# Patient Record
Sex: Female | Born: 1945 | Race: White | Hispanic: No | Marital: Married | Smoking: Never smoker
Health system: Southern US, Community
[De-identification: ages and names within clinical notes are randomized; demographics above are authoritative.]

## PROBLEM LIST (undated history)

## (undated) DIAGNOSIS — K900 Celiac disease: Secondary | ICD-10-CM

## (undated) DIAGNOSIS — E2839 Other primary ovarian failure: Secondary | ICD-10-CM

## (undated) DIAGNOSIS — K219 Gastro-esophageal reflux disease without esophagitis: Secondary | ICD-10-CM

## (undated) DIAGNOSIS — K589 Irritable bowel syndrome without diarrhea: Secondary | ICD-10-CM

## (undated) DIAGNOSIS — K649 Unspecified hemorrhoids: Secondary | ICD-10-CM

## (undated) DIAGNOSIS — K921 Melena: Secondary | ICD-10-CM

## (undated) DIAGNOSIS — F419 Anxiety disorder, unspecified: Secondary | ICD-10-CM

## (undated) DIAGNOSIS — E785 Hyperlipidemia, unspecified: Secondary | ICD-10-CM

## (undated) DIAGNOSIS — Z87898 Personal history of other specified conditions: Secondary | ICD-10-CM

## (undated) DIAGNOSIS — I517 Cardiomegaly: Secondary | ICD-10-CM

## (undated) DIAGNOSIS — M3500 Sicca syndrome, unspecified: Secondary | ICD-10-CM

## (undated) DIAGNOSIS — R1013 Epigastric pain: Secondary | ICD-10-CM

## (undated) DIAGNOSIS — K5900 Constipation, unspecified: Secondary | ICD-10-CM

## (undated) DIAGNOSIS — D72820 Lymphocytosis (symptomatic): Secondary | ICD-10-CM

## (undated) DIAGNOSIS — R232 Flushing: Secondary | ICD-10-CM

## (undated) DIAGNOSIS — K8020 Calculus of gallbladder without cholecystitis without obstruction: Secondary | ICD-10-CM

## (undated) DIAGNOSIS — R109 Unspecified abdominal pain: Secondary | ICD-10-CM

## (undated) DIAGNOSIS — M542 Cervicalgia: Secondary | ICD-10-CM

## (undated) DIAGNOSIS — F32A Depression, unspecified: Secondary | ICD-10-CM

## (undated) HISTORY — DX: Cervicalgia: M54.2

## (undated) HISTORY — DX: Celiac disease: K90.0

## (undated) HISTORY — DX: Cardiomegaly: I51.7

## (undated) HISTORY — DX: Depression, unspecified: F32.A

## (undated) HISTORY — DX: Epigastric pain: R10.13

## (undated) HISTORY — DX: Irritable bowel syndrome, unspecified: K58.9

## (undated) HISTORY — DX: Constipation, unspecified: K59.00

## (undated) HISTORY — DX: Other primary ovarian failure: E28.39

## (undated) HISTORY — DX: Gastro-esophageal reflux disease without esophagitis: K21.9

## (undated) HISTORY — DX: Unspecified hemorrhoids: K64.9

## (undated) HISTORY — PX: CHOLECYSTECTOMY: SHX55

## (undated) HISTORY — DX: Flushing: R23.2

## (undated) HISTORY — DX: Anxiety disorder, unspecified: F41.9

## (undated) HISTORY — DX: Unspecified abdominal pain: R10.9

## (undated) HISTORY — DX: Calculus of gallbladder without cholecystitis without obstruction: K80.20

## (undated) HISTORY — DX: Sjogren syndrome, unspecified: M35.00

## (undated) HISTORY — DX: Lymphocytosis (symptomatic): D72.820

## (undated) HISTORY — DX: Hyperlipidemia, unspecified: E78.5

## (undated) HISTORY — DX: Melena: K92.1

## (undated) HISTORY — DX: Personal history of other specified conditions: Z87.898

---

## 1974-05-03 HISTORY — PX: AUGMENTATION MAMMAPLASTY: SUR837

## 1991-05-04 HISTORY — PX: AUGMENTATION MAMMAPLASTY: SUR837

## 1999-04-13 ENCOUNTER — Encounter: Payer: Self-pay | Admitting: Obstetrics and Gynecology

## 1999-04-13 ENCOUNTER — Encounter: Admission: RE | Admit: 1999-04-13 | Discharge: 1999-04-13 | Payer: Self-pay | Admitting: Obstetrics and Gynecology

## 1999-05-22 ENCOUNTER — Encounter: Admission: RE | Admit: 1999-05-22 | Discharge: 1999-05-22 | Payer: Self-pay | Admitting: Obstetrics and Gynecology

## 1999-05-22 ENCOUNTER — Encounter: Payer: Self-pay | Admitting: Obstetrics and Gynecology

## 2000-04-13 ENCOUNTER — Encounter: Payer: Self-pay | Admitting: Obstetrics and Gynecology

## 2000-04-13 ENCOUNTER — Encounter: Admission: RE | Admit: 2000-04-13 | Discharge: 2000-04-13 | Payer: Self-pay | Admitting: Obstetrics and Gynecology

## 2001-04-17 ENCOUNTER — Encounter: Payer: Self-pay | Admitting: Obstetrics and Gynecology

## 2001-04-17 ENCOUNTER — Encounter: Admission: RE | Admit: 2001-04-17 | Discharge: 2001-04-17 | Payer: Self-pay | Admitting: Obstetrics and Gynecology

## 2003-02-06 ENCOUNTER — Encounter: Admission: RE | Admit: 2003-02-06 | Discharge: 2003-02-06 | Payer: Self-pay | Admitting: Gastroenterology

## 2003-02-06 ENCOUNTER — Encounter: Payer: Self-pay | Admitting: Gastroenterology

## 2003-03-20 ENCOUNTER — Encounter (INDEPENDENT_AMBULATORY_CARE_PROVIDER_SITE_OTHER): Payer: Self-pay | Admitting: Specialist

## 2003-03-20 ENCOUNTER — Observation Stay (HOSPITAL_COMMUNITY): Admission: RE | Admit: 2003-03-20 | Discharge: 2003-03-21 | Payer: Self-pay | Admitting: Surgery

## 2004-06-08 ENCOUNTER — Encounter: Admission: RE | Admit: 2004-06-08 | Discharge: 2004-06-08 | Payer: Self-pay | Admitting: Surgery

## 2004-06-12 ENCOUNTER — Encounter: Admission: RE | Admit: 2004-06-12 | Discharge: 2004-06-12 | Payer: Self-pay | Admitting: Surgery

## 2004-08-24 ENCOUNTER — Encounter: Admission: RE | Admit: 2004-08-24 | Discharge: 2004-08-24 | Payer: Self-pay | Admitting: Family Medicine

## 2004-12-02 ENCOUNTER — Encounter: Admission: RE | Admit: 2004-12-02 | Discharge: 2004-12-02 | Payer: Self-pay | Admitting: Obstetrics and Gynecology

## 2005-05-10 ENCOUNTER — Encounter: Admission: RE | Admit: 2005-05-10 | Discharge: 2005-05-10 | Payer: Self-pay | Admitting: Obstetrics and Gynecology

## 2006-05-19 ENCOUNTER — Encounter: Admission: RE | Admit: 2006-05-19 | Discharge: 2006-05-19 | Payer: Self-pay | Admitting: Obstetrics and Gynecology

## 2007-06-07 ENCOUNTER — Encounter: Admission: RE | Admit: 2007-06-07 | Discharge: 2007-06-07 | Payer: Self-pay | Admitting: Obstetrics and Gynecology

## 2008-06-07 ENCOUNTER — Encounter: Admission: RE | Admit: 2008-06-07 | Discharge: 2008-06-07 | Payer: Self-pay | Admitting: Obstetrics and Gynecology

## 2009-06-09 ENCOUNTER — Encounter: Admission: RE | Admit: 2009-06-09 | Discharge: 2009-06-09 | Payer: Self-pay | Admitting: Obstetrics and Gynecology

## 2010-05-23 ENCOUNTER — Other Ambulatory Visit: Payer: Self-pay | Admitting: Obstetrics and Gynecology

## 2010-05-23 DIAGNOSIS — Z1231 Encounter for screening mammogram for malignant neoplasm of breast: Secondary | ICD-10-CM

## 2010-05-23 DIAGNOSIS — Z1239 Encounter for other screening for malignant neoplasm of breast: Secondary | ICD-10-CM

## 2010-06-10 ENCOUNTER — Ambulatory Visit
Admission: RE | Admit: 2010-06-10 | Discharge: 2010-06-10 | Disposition: A | Discharge status: Home / Self Care | Payer: 59 | Source: Ambulatory Visit | Attending: Obstetrics and Gynecology | Admitting: Obstetrics and Gynecology

## 2010-06-10 DIAGNOSIS — Z1231 Encounter for screening mammogram for malignant neoplasm of breast: Secondary | ICD-10-CM

## 2010-09-18 NOTE — Op Note (Signed)
NAME:  Diana Haley, PAULL                       ACCOUNT NO.:  [id number]   MEDICAL RECORD NO.:  [id number]                   PATIENT TYPE:  AMB   LOCATION:  DAY                                  FACILITY:  [facility]   PHYSICIAN:  Currie Paris, M.D.           DATE OF BIRTH:  06/03/1945   DATE OF PROCEDURE:  03/20/2003  DATE OF DISCHARGE:                                 OPERATIVE REPORT   CCS#:  [id number]   PREOPERATIVE DIAGNOSES:  1. Symptomatic cholelithiasis.  2. Umbilical hernia   POSTOPERATIVE DIAGNOSES:  1. Symptomatic cholelithiasis.  2. Umbilical hernia   OPERATION:  1. Laparoscopic cholecystectomy with operative cholangiogram  2. Repair umbilical hernia.   SURGEON:  Currie Paris, M.D.   ASSISTANT:  Lebron Conners, M.D.   ANESTHESIA:  General endotracheal.   CLINICAL HISTORY:  This patient has biliary colic type symptoms and a  finding of gallstones. She had a small incidentally noted umbilical hernia  that she wished [place] to repair.   DESCRIPTION OF PROCEDURE:  The patient was seen in the holding area and had  no further questions. She discussed laparoscopic cholecystectomy again and  the fact we would attempt to repair her umbilical hernia.   She was taken to the operating room and after satisfactory general  endotracheal anesthesia had been obtained, the abdomen was prepped and  draped. 0.25% Marcaine was used for the incisions. The umbilical incision  was made first, the fascia identified and opened including opening into the  small hernia defect. A pursestring was placed to hold the Hasson and that  was placed and the abdomen insufflated to 15. The patient was placed in  reverse Trendelenburg, tilted to the left. A 10/11 trocar was placed in the  epigastrium and two 5 mm laterally. The gallbladder was relatively normal  appearing. It was retracted over the liver and peritoneum over the cystic  duct and the triangle of Calot opened. I could see a nice long  cystic duct,  see the cystic artery coming up on the gallbladder and both were dissected  out. I made a nice window in the triangle to be sure there were no other  structures. Three clips were placed on the cystic artery and one on the  cystic  duct at its junction with the gallbladder. The cystic duct was  opened with scissors and a Cook catheter placed percutaneously, threaded  into the cystic duct and held with a clip. Operative cholangiography showed  a long cystic duct and relatively small common duct system with filling of  the hepatic radicles and duodenum and no filling defects.   The cystic duct was removed and three clips placed on the stay side of the  cystic duct. It was divided. The cystic artery was divided. The gallbladder  was removed from below to above with the posterior branch of the artery  about halfway up coming directly out of the gallbladder requiring some  clips. Just prior to disconnecting, we irrigated and made sure everything  dry and then disconnected the gallbladder and brought it out the umbilical  port. A final check for hemostasis was made and lateral ports were removed.   At this point, I then used the cautery to free up the subcutaneous tissue  from the fascia to identify the entire incision for the umbilical incision  and once that was all cleared up and we had the hernia as part of the main  fascial opening, I closed this with interrupted #0 Prolenes. The abdomen was  reinsufflated and I made a final check to make sure that the repair intact  and additional local infiltrated. The abdomen was then deflated through the  epigastric port. The skin was closed with 4-0 Monocryl subcuticular and  Dermabond. The patient tolerated the procedure well. There were no operative  complications and all counts were correct.                                               Currie Paris, M.D.    CJS/MEDQ  D:  03/20/2003  T:  03/20/2003  Job:  [id number]   cc:    Fayrene Fearing L. Malon Kindle., M.D.  [address], [address]  [place]  [place] [address]  Fax: [phone]

## 2011-06-08 ENCOUNTER — Other Ambulatory Visit: Payer: Self-pay | Admitting: Family Medicine

## 2011-06-08 DIAGNOSIS — Z1231 Encounter for screening mammogram for malignant neoplasm of breast: Secondary | ICD-10-CM

## 2011-06-24 ENCOUNTER — Ambulatory Visit
Admission: RE | Admit: 2011-06-24 | Discharge: 2011-06-24 | Disposition: A | Discharge status: Home / Self Care | Payer: 59 | Source: Ambulatory Visit | Attending: Family Medicine | Admitting: Family Medicine

## 2011-06-24 DIAGNOSIS — Z1231 Encounter for screening mammogram for malignant neoplasm of breast: Secondary | ICD-10-CM

## 2012-05-23 ENCOUNTER — Other Ambulatory Visit: Payer: Self-pay | Admitting: Family Medicine

## 2012-05-23 DIAGNOSIS — Z1231 Encounter for screening mammogram for malignant neoplasm of breast: Secondary | ICD-10-CM

## 2012-06-27 ENCOUNTER — Ambulatory Visit
Admission: RE | Admit: 2012-06-27 | Discharge: 2012-06-27 | Disposition: A | Discharge status: Home / Self Care | Payer: 59 | Source: Ambulatory Visit | Attending: Family Medicine | Admitting: Family Medicine

## 2012-06-27 DIAGNOSIS — Z1231 Encounter for screening mammogram for malignant neoplasm of breast: Secondary | ICD-10-CM

## 2012-07-03 ENCOUNTER — Other Ambulatory Visit: Payer: Self-pay | Admitting: Family Medicine

## 2012-07-03 DIAGNOSIS — R928 Other abnormal and inconclusive findings on diagnostic imaging of breast: Secondary | ICD-10-CM

## 2012-07-13 ENCOUNTER — Ambulatory Visit
Admission: RE | Admit: 2012-07-13 | Discharge: 2012-07-13 | Disposition: A | Discharge status: Home / Self Care | Payer: 59 | Source: Ambulatory Visit | Attending: Family Medicine | Admitting: Family Medicine

## 2012-07-13 DIAGNOSIS — R928 Other abnormal and inconclusive findings on diagnostic imaging of breast: Secondary | ICD-10-CM

## 2012-08-21 DIAGNOSIS — H04339 Acute lacrimal canaliculitis of unspecified lacrimal passage: Secondary | ICD-10-CM | POA: Diagnosis not present

## 2012-08-21 DIAGNOSIS — H02849 Edema of unspecified eye, unspecified eyelid: Secondary | ICD-10-CM | POA: Diagnosis not present

## 2012-08-21 DIAGNOSIS — H5710 Ocular pain, unspecified eye: Secondary | ICD-10-CM | POA: Diagnosis not present

## 2012-08-21 DIAGNOSIS — H04129 Dry eye syndrome of unspecified lacrimal gland: Secondary | ICD-10-CM | POA: Diagnosis not present

## 2012-10-10 ENCOUNTER — Other Ambulatory Visit: Payer: Self-pay | Admitting: Family Medicine

## 2012-10-10 ENCOUNTER — Other Ambulatory Visit (HOSPITAL_COMMUNITY)
Admission: RE | Admit: 2012-10-10 | Discharge: 2012-10-10 | Disposition: A | Discharge status: Home / Self Care | Payer: 59 | Source: Ambulatory Visit | Attending: Family Medicine | Admitting: Family Medicine

## 2012-10-10 DIAGNOSIS — Z124 Encounter for screening for malignant neoplasm of cervix: Secondary | ICD-10-CM | POA: Insufficient documentation

## 2013-01-30 ENCOUNTER — Other Ambulatory Visit: Payer: Self-pay | Admitting: Dermatology

## 2013-03-05 ENCOUNTER — Other Ambulatory Visit: Payer: Self-pay | Admitting: Dermatology

## 2013-03-05 DIAGNOSIS — D485 Neoplasm of uncertain behavior of skin: Secondary | ICD-10-CM | POA: Diagnosis not present

## 2013-03-05 DIAGNOSIS — L821 Other seborrheic keratosis: Secondary | ICD-10-CM | POA: Diagnosis not present

## 2013-03-06 ENCOUNTER — Ambulatory Visit (INDEPENDENT_AMBULATORY_CARE_PROVIDER_SITE_OTHER): Payer: 59

## 2013-03-06 ENCOUNTER — Ambulatory Visit (INDEPENDENT_AMBULATORY_CARE_PROVIDER_SITE_OTHER): Payer: 59 | Admitting: Podiatry

## 2013-03-06 ENCOUNTER — Encounter: Payer: Self-pay | Admitting: Podiatry

## 2013-03-06 ENCOUNTER — Ambulatory Visit: Payer: Self-pay | Admitting: Podiatry

## 2013-03-06 DIAGNOSIS — L608 Other nail disorders: Secondary | ICD-10-CM

## 2013-03-06 DIAGNOSIS — M79675 Pain in left toe(s): Secondary | ICD-10-CM

## 2013-03-06 DIAGNOSIS — M2040 Other hammer toe(s) (acquired), unspecified foot: Secondary | ICD-10-CM

## 2013-03-06 DIAGNOSIS — M79609 Pain in unspecified limb: Secondary | ICD-10-CM

## 2013-03-06 NOTE — Progress Notes (Signed)
Diana Haley presents today for on referral from Dr. Stacie Acres with a chief complaint of a crooked toe second digit left foot. She states that she hit only postpone chasing her dog across the house. She's also complaining of some thickened discolored toenails to the hallux fourth digit and fifth digits of the left foot. She states of the second toe has been painful for a while and she doesn't like the way it looks in leans toward the hallux left.  Objective: Vital signs are stable she is alert and oriented x3. I have reviewed her past medical history medications and allergies. Pulses are palpable to the left foot. Neurologic sensorium is intact left foot. Deep tendon reflexes are intact. Muscle strength is normal. Cutaneous evaluation demonstrates supple well hydrated cutis with the exception of discolored dystrophic hallux nail fourth digital nail plate and fifth digital nail plate left. Orthopedic evaluation does demonstrate a medial deviation of the second toe at the PIPJ. Radiographs taken today do demonstrate what appears to be a compression fracture at the head of the second proximal phalanx left foot. No other osseous abnormalities were noted. She also has what appears to be an arthritic joint secondary to trauma.  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  Plan: We discussed the etiology pathology conservative versus surgical therapies. We discussed fusion of the PIPJ as well as arthroplasty of the PIPJ second digit left foot. We took samples of the nail plate and soft tissue today to send out for a fungal culture. I will followup with her in about 5 weeks. We may discuss surgical options at that time.

## 2013-03-06 NOTE — Progress Notes (Signed)
N - numbness, discomfort L - 2nd toe left D - 3 mos. O - sudden C - injuried toe while chasing dog, no better, stiffness A - walking, certain shoes T - Dr. Stacie Acres eval-xrayed, states toe is "crushed", recommended toe implant

## 2013-03-07 DIAGNOSIS — B351 Tinea unguium: Secondary | ICD-10-CM | POA: Diagnosis not present

## 2013-03-19 DIAGNOSIS — H40019 Open angle with borderline findings, low risk, unspecified eye: Secondary | ICD-10-CM | POA: Diagnosis not present

## 2013-03-23 DIAGNOSIS — R059 Cough, unspecified: Secondary | ICD-10-CM | POA: Diagnosis not present

## 2013-03-23 DIAGNOSIS — R05 Cough: Secondary | ICD-10-CM | POA: Diagnosis not present

## 2013-03-23 DIAGNOSIS — R6889 Other general symptoms and signs: Secondary | ICD-10-CM | POA: Diagnosis not present

## 2013-05-01 ENCOUNTER — Other Ambulatory Visit: Payer: Self-pay

## 2013-05-01 DIAGNOSIS — Z1231 Encounter for screening mammogram for malignant neoplasm of breast: Secondary | ICD-10-CM

## 2013-05-15 DIAGNOSIS — H0289 Other specified disorders of eyelid: Secondary | ICD-10-CM | POA: Diagnosis not present

## 2013-05-15 DIAGNOSIS — M3500 Sicca syndrome, unspecified: Secondary | ICD-10-CM | POA: Diagnosis not present

## 2013-05-15 DIAGNOSIS — H40009 Preglaucoma, unspecified, unspecified eye: Secondary | ICD-10-CM | POA: Diagnosis not present

## 2013-05-15 DIAGNOSIS — H168 Other keratitis: Secondary | ICD-10-CM | POA: Diagnosis not present

## 2013-05-31 ENCOUNTER — Encounter: Payer: Self-pay | Admitting: Podiatry

## 2013-06-21 DIAGNOSIS — M310 Hypersensitivity angiitis: Secondary | ICD-10-CM | POA: Diagnosis not present

## 2013-06-21 DIAGNOSIS — M3500 Sicca syndrome, unspecified: Secondary | ICD-10-CM | POA: Diagnosis not present

## 2013-06-27 DIAGNOSIS — J069 Acute upper respiratory infection, unspecified: Secondary | ICD-10-CM | POA: Diagnosis not present

## 2013-07-06 DIAGNOSIS — J40 Bronchitis, not specified as acute or chronic: Secondary | ICD-10-CM | POA: Diagnosis not present

## 2013-07-11 DIAGNOSIS — H40009 Preglaucoma, unspecified, unspecified eye: Secondary | ICD-10-CM | POA: Diagnosis not present

## 2013-07-11 DIAGNOSIS — H168 Other keratitis: Secondary | ICD-10-CM | POA: Diagnosis not present

## 2013-07-11 DIAGNOSIS — M3500 Sicca syndrome, unspecified: Secondary | ICD-10-CM | POA: Diagnosis not present

## 2013-07-11 DIAGNOSIS — H0289 Other specified disorders of eyelid: Secondary | ICD-10-CM | POA: Diagnosis not present

## 2013-07-13 ENCOUNTER — Ambulatory Visit
Admission: RE | Admit: 2013-07-13 | Discharge: 2013-07-13 | Disposition: A | Discharge status: Home / Self Care | Payer: Medicare Other | Source: Ambulatory Visit

## 2013-07-13 DIAGNOSIS — Z1231 Encounter for screening mammogram for malignant neoplasm of breast: Secondary | ICD-10-CM | POA: Diagnosis not present

## 2013-07-16 ENCOUNTER — Other Ambulatory Visit: Payer: Self-pay | Admitting: Family Medicine

## 2013-07-16 DIAGNOSIS — H40029 Open angle with borderline findings, high risk, unspecified eye: Secondary | ICD-10-CM | POA: Diagnosis not present

## 2013-07-16 DIAGNOSIS — R928 Other abnormal and inconclusive findings on diagnostic imaging of breast: Secondary | ICD-10-CM

## 2013-07-19 ENCOUNTER — Ambulatory Visit
Admission: RE | Admit: 2013-07-19 | Discharge: 2013-07-19 | Disposition: A | Discharge status: Home / Self Care | Payer: Medicare Other | Source: Ambulatory Visit | Attending: Family Medicine | Admitting: Family Medicine

## 2013-07-19 DIAGNOSIS — R928 Other abnormal and inconclusive findings on diagnostic imaging of breast: Secondary | ICD-10-CM

## 2013-07-19 DIAGNOSIS — R922 Inconclusive mammogram: Secondary | ICD-10-CM | POA: Diagnosis not present

## 2013-08-24 DIAGNOSIS — M2040 Other hammer toe(s) (acquired), unspecified foot: Secondary | ICD-10-CM | POA: Diagnosis not present

## 2013-08-24 DIAGNOSIS — M7750 Other enthesopathy of unspecified foot: Secondary | ICD-10-CM | POA: Diagnosis not present

## 2013-09-13 DIAGNOSIS — H40029 Open angle with borderline findings, high risk, unspecified eye: Secondary | ICD-10-CM | POA: Diagnosis not present

## 2013-10-02 DIAGNOSIS — G8918 Other acute postprocedural pain: Secondary | ICD-10-CM | POA: Diagnosis not present

## 2013-10-02 DIAGNOSIS — M2040 Other hammer toe(s) (acquired), unspecified foot: Secondary | ICD-10-CM | POA: Diagnosis not present

## 2013-10-02 DIAGNOSIS — L608 Other nail disorders: Secondary | ICD-10-CM | POA: Diagnosis not present

## 2013-10-12 DIAGNOSIS — Z23 Encounter for immunization: Secondary | ICD-10-CM | POA: Diagnosis not present

## 2013-10-12 DIAGNOSIS — E785 Hyperlipidemia, unspecified: Secondary | ICD-10-CM | POA: Diagnosis not present

## 2013-10-12 DIAGNOSIS — Z0000 Encounter for general adult medical examination without abnormal findings: Secondary | ICD-10-CM | POA: Diagnosis not present

## 2013-10-12 DIAGNOSIS — D72820 Lymphocytosis (symptomatic): Secondary | ICD-10-CM | POA: Diagnosis not present

## 2013-10-12 DIAGNOSIS — R9431 Abnormal electrocardiogram [ECG] [EKG]: Secondary | ICD-10-CM | POA: Diagnosis not present

## 2013-10-15 DIAGNOSIS — H40029 Open angle with borderline findings, high risk, unspecified eye: Secondary | ICD-10-CM | POA: Diagnosis not present

## 2013-10-22 ENCOUNTER — Encounter: Payer: Self-pay | Admitting: *Deleted

## 2013-10-30 DIAGNOSIS — M2040 Other hammer toe(s) (acquired), unspecified foot: Secondary | ICD-10-CM | POA: Diagnosis not present

## 2013-11-01 DIAGNOSIS — M2040 Other hammer toe(s) (acquired), unspecified foot: Secondary | ICD-10-CM | POA: Diagnosis not present

## 2013-11-06 DIAGNOSIS — M2040 Other hammer toe(s) (acquired), unspecified foot: Secondary | ICD-10-CM | POA: Diagnosis not present

## 2013-11-09 DIAGNOSIS — M2040 Other hammer toe(s) (acquired), unspecified foot: Secondary | ICD-10-CM | POA: Diagnosis not present

## 2013-11-13 DIAGNOSIS — M2040 Other hammer toe(s) (acquired), unspecified foot: Secondary | ICD-10-CM | POA: Diagnosis not present

## 2013-11-14 DIAGNOSIS — H04129 Dry eye syndrome of unspecified lacrimal gland: Secondary | ICD-10-CM | POA: Diagnosis not present

## 2013-11-14 DIAGNOSIS — H2510 Age-related nuclear cataract, unspecified eye: Secondary | ICD-10-CM | POA: Diagnosis not present

## 2013-11-14 DIAGNOSIS — M3500 Sicca syndrome, unspecified: Secondary | ICD-10-CM | POA: Diagnosis not present

## 2013-11-15 DIAGNOSIS — M2040 Other hammer toe(s) (acquired), unspecified foot: Secondary | ICD-10-CM | POA: Diagnosis not present

## 2013-11-16 DIAGNOSIS — Z4789 Encounter for other orthopedic aftercare: Secondary | ICD-10-CM | POA: Diagnosis not present

## 2013-11-16 DIAGNOSIS — M2040 Other hammer toe(s) (acquired), unspecified foot: Secondary | ICD-10-CM | POA: Diagnosis not present

## 2013-11-19 DIAGNOSIS — Z780 Asymptomatic menopausal state: Secondary | ICD-10-CM | POA: Diagnosis not present

## 2013-11-20 DIAGNOSIS — M2040 Other hammer toe(s) (acquired), unspecified foot: Secondary | ICD-10-CM | POA: Diagnosis not present

## 2013-11-22 DIAGNOSIS — M2040 Other hammer toe(s) (acquired), unspecified foot: Secondary | ICD-10-CM | POA: Diagnosis not present

## 2013-11-27 DIAGNOSIS — M2040 Other hammer toe(s) (acquired), unspecified foot: Secondary | ICD-10-CM | POA: Diagnosis not present

## 2013-11-30 DIAGNOSIS — M2040 Other hammer toe(s) (acquired), unspecified foot: Secondary | ICD-10-CM | POA: Diagnosis not present

## 2013-12-19 DIAGNOSIS — M310 Hypersensitivity angiitis: Secondary | ICD-10-CM | POA: Diagnosis not present

## 2013-12-19 DIAGNOSIS — M3500 Sicca syndrome, unspecified: Secondary | ICD-10-CM | POA: Diagnosis not present

## 2014-01-11 DIAGNOSIS — H04129 Dry eye syndrome of unspecified lacrimal gland: Secondary | ICD-10-CM | POA: Diagnosis not present

## 2014-01-11 DIAGNOSIS — H2510 Age-related nuclear cataract, unspecified eye: Secondary | ICD-10-CM | POA: Diagnosis not present

## 2014-01-11 DIAGNOSIS — M3500 Sicca syndrome, unspecified: Secondary | ICD-10-CM | POA: Diagnosis not present

## 2014-01-18 DIAGNOSIS — Z23 Encounter for immunization: Secondary | ICD-10-CM | POA: Diagnosis not present

## 2014-01-29 DIAGNOSIS — L738 Other specified follicular disorders: Secondary | ICD-10-CM | POA: Diagnosis not present

## 2014-01-29 DIAGNOSIS — D1801 Hemangioma of skin and subcutaneous tissue: Secondary | ICD-10-CM | POA: Diagnosis not present

## 2014-01-29 DIAGNOSIS — D235 Other benign neoplasm of skin of trunk: Secondary | ICD-10-CM | POA: Diagnosis not present

## 2014-01-29 DIAGNOSIS — L821 Other seborrheic keratosis: Secondary | ICD-10-CM | POA: Diagnosis not present

## 2014-01-29 DIAGNOSIS — L678 Other hair color and hair shaft abnormalities: Secondary | ICD-10-CM | POA: Diagnosis not present

## 2014-01-29 DIAGNOSIS — B353 Tinea pedis: Secondary | ICD-10-CM | POA: Diagnosis not present

## 2014-03-12 DIAGNOSIS — H2513 Age-related nuclear cataract, bilateral: Secondary | ICD-10-CM | POA: Diagnosis not present

## 2014-04-08 DIAGNOSIS — D72820 Lymphocytosis (symptomatic): Secondary | ICD-10-CM | POA: Diagnosis not present

## 2014-04-08 DIAGNOSIS — M3500 Sicca syndrome, unspecified: Secondary | ICD-10-CM | POA: Diagnosis not present

## 2014-04-30 ENCOUNTER — Other Ambulatory Visit: Payer: Self-pay

## 2014-04-30 DIAGNOSIS — Z1231 Encounter for screening mammogram for malignant neoplasm of breast: Secondary | ICD-10-CM

## 2014-06-20 DIAGNOSIS — M310 Hypersensitivity angiitis: Secondary | ICD-10-CM | POA: Diagnosis not present

## 2014-06-20 DIAGNOSIS — M3501 Sicca syndrome with keratoconjunctivitis: Secondary | ICD-10-CM | POA: Diagnosis not present

## 2014-07-15 ENCOUNTER — Ambulatory Visit
Admission: RE | Admit: 2014-07-15 | Discharge: 2014-07-15 | Disposition: A | Discharge status: Home / Self Care | Payer: Medicare Other | Source: Ambulatory Visit

## 2014-07-15 DIAGNOSIS — Z1231 Encounter for screening mammogram for malignant neoplasm of breast: Secondary | ICD-10-CM | POA: Diagnosis not present

## 2014-09-26 DIAGNOSIS — S92535D Nondisplaced fracture of distal phalanx of left lesser toe(s), subsequent encounter for fracture with routine healing: Secondary | ICD-10-CM | POA: Diagnosis not present

## 2014-10-03 DIAGNOSIS — J209 Acute bronchitis, unspecified: Secondary | ICD-10-CM | POA: Diagnosis not present

## 2014-10-16 DIAGNOSIS — M3500 Sicca syndrome, unspecified: Secondary | ICD-10-CM | POA: Diagnosis not present

## 2014-10-16 DIAGNOSIS — Z0000 Encounter for general adult medical examination without abnormal findings: Secondary | ICD-10-CM | POA: Diagnosis not present

## 2014-10-16 DIAGNOSIS — E785 Hyperlipidemia, unspecified: Secondary | ICD-10-CM | POA: Diagnosis not present

## 2014-10-31 DIAGNOSIS — Z23 Encounter for immunization: Secondary | ICD-10-CM | POA: Diagnosis not present

## 2014-11-06 DIAGNOSIS — H2513 Age-related nuclear cataract, bilateral: Secondary | ICD-10-CM | POA: Diagnosis not present

## 2014-11-06 DIAGNOSIS — H5203 Hypermetropia, bilateral: Secondary | ICD-10-CM | POA: Diagnosis not present

## 2014-11-06 DIAGNOSIS — H40023 Open angle with borderline findings, high risk, bilateral: Secondary | ICD-10-CM | POA: Diagnosis not present

## 2014-11-08 DIAGNOSIS — S90122D Contusion of left lesser toe(s) without damage to nail, subsequent encounter: Secondary | ICD-10-CM | POA: Diagnosis not present

## 2014-11-28 DIAGNOSIS — Z7189 Other specified counseling: Secondary | ICD-10-CM | POA: Diagnosis not present

## 2014-12-03 DIAGNOSIS — H40023 Open angle with borderline findings, high risk, bilateral: Secondary | ICD-10-CM | POA: Diagnosis not present

## 2014-12-09 DIAGNOSIS — M3500 Sicca syndrome, unspecified: Secondary | ICD-10-CM | POA: Diagnosis not present

## 2014-12-09 DIAGNOSIS — M310 Hypersensitivity angiitis: Secondary | ICD-10-CM | POA: Diagnosis not present

## 2015-01-13 DIAGNOSIS — Z23 Encounter for immunization: Secondary | ICD-10-CM | POA: Diagnosis not present

## 2015-03-31 DIAGNOSIS — J209 Acute bronchitis, unspecified: Secondary | ICD-10-CM | POA: Diagnosis not present

## 2015-06-10 DIAGNOSIS — J329 Chronic sinusitis, unspecified: Secondary | ICD-10-CM | POA: Diagnosis not present

## 2015-06-11 ENCOUNTER — Other Ambulatory Visit: Payer: Self-pay

## 2015-06-11 DIAGNOSIS — Z1231 Encounter for screening mammogram for malignant neoplasm of breast: Secondary | ICD-10-CM

## 2015-06-11 DIAGNOSIS — M3500 Sicca syndrome, unspecified: Secondary | ICD-10-CM | POA: Diagnosis not present

## 2015-06-11 DIAGNOSIS — M310 Hypersensitivity angiitis: Secondary | ICD-10-CM | POA: Diagnosis not present

## 2015-06-11 DIAGNOSIS — Z9882 Breast implant status: Secondary | ICD-10-CM

## 2015-07-14 DIAGNOSIS — Z1211 Encounter for screening for malignant neoplasm of colon: Secondary | ICD-10-CM | POA: Diagnosis not present

## 2015-07-18 ENCOUNTER — Ambulatory Visit
Admission: RE | Admit: 2015-07-18 | Discharge: 2015-07-18 | Disposition: A | Discharge status: Home / Self Care | Payer: Medicare Other | Source: Ambulatory Visit

## 2015-07-18 DIAGNOSIS — Z1231 Encounter for screening mammogram for malignant neoplasm of breast: Secondary | ICD-10-CM

## 2015-07-18 DIAGNOSIS — Z9882 Breast implant status: Secondary | ICD-10-CM

## 2015-08-04 ENCOUNTER — Telehealth: Payer: Self-pay | Admitting: *Deleted

## 2015-08-04 NOTE — Telephone Encounter (Addendum)
Pt states she had a + fungal culture 1-2 years, and had the toenail removed by another doctor, but is continuing to have fungal problems.  I spoke with pt's husband and he said to call again pt was out. I called and left message for pt to make an appt that there is fungus that can be on the skin that would affect her skin and toenails, and Dr. Milinda Pointer would be able to evaluate and discuss treatment.

## 2015-11-06 DIAGNOSIS — F419 Anxiety disorder, unspecified: Secondary | ICD-10-CM | POA: Diagnosis not present

## 2015-11-06 DIAGNOSIS — Z0000 Encounter for general adult medical examination without abnormal findings: Secondary | ICD-10-CM | POA: Diagnosis not present

## 2015-11-06 DIAGNOSIS — Z1159 Encounter for screening for other viral diseases: Secondary | ICD-10-CM | POA: Diagnosis not present

## 2015-11-06 DIAGNOSIS — M3500 Sicca syndrome, unspecified: Secondary | ICD-10-CM | POA: Diagnosis not present

## 2015-11-06 DIAGNOSIS — M25512 Pain in left shoulder: Secondary | ICD-10-CM | POA: Diagnosis not present

## 2015-11-06 DIAGNOSIS — E785 Hyperlipidemia, unspecified: Secondary | ICD-10-CM | POA: Diagnosis not present

## 2015-11-19 DIAGNOSIS — H52203 Unspecified astigmatism, bilateral: Secondary | ICD-10-CM | POA: Diagnosis not present

## 2015-11-19 DIAGNOSIS — H5203 Hypermetropia, bilateral: Secondary | ICD-10-CM | POA: Diagnosis not present

## 2015-11-19 DIAGNOSIS — H2513 Age-related nuclear cataract, bilateral: Secondary | ICD-10-CM | POA: Diagnosis not present

## 2015-11-19 DIAGNOSIS — H40023 Open angle with borderline findings, high risk, bilateral: Secondary | ICD-10-CM | POA: Diagnosis not present

## 2015-12-08 DIAGNOSIS — F329 Major depressive disorder, single episode, unspecified: Secondary | ICD-10-CM | POA: Diagnosis not present

## 2015-12-09 DIAGNOSIS — Z79899 Other long term (current) drug therapy: Secondary | ICD-10-CM | POA: Diagnosis not present

## 2015-12-09 DIAGNOSIS — M310 Hypersensitivity angiitis: Secondary | ICD-10-CM | POA: Diagnosis not present

## 2015-12-09 DIAGNOSIS — M3500 Sicca syndrome, unspecified: Secondary | ICD-10-CM | POA: Diagnosis not present

## 2016-01-08 DIAGNOSIS — L309 Dermatitis, unspecified: Secondary | ICD-10-CM | POA: Diagnosis not present

## 2016-01-14 DIAGNOSIS — D2261 Melanocytic nevi of right upper limb, including shoulder: Secondary | ICD-10-CM | POA: Diagnosis not present

## 2016-01-14 DIAGNOSIS — L821 Other seborrheic keratosis: Secondary | ICD-10-CM | POA: Diagnosis not present

## 2016-01-14 DIAGNOSIS — D1801 Hemangioma of skin and subcutaneous tissue: Secondary | ICD-10-CM | POA: Diagnosis not present

## 2016-01-14 DIAGNOSIS — D2262 Melanocytic nevi of left upper limb, including shoulder: Secondary | ICD-10-CM | POA: Diagnosis not present

## 2016-01-26 DIAGNOSIS — H40023 Open angle with borderline findings, high risk, bilateral: Secondary | ICD-10-CM | POA: Diagnosis not present

## 2016-01-26 DIAGNOSIS — Z23 Encounter for immunization: Secondary | ICD-10-CM | POA: Diagnosis not present

## 2016-06-07 ENCOUNTER — Other Ambulatory Visit: Payer: Self-pay | Admitting: Family Medicine

## 2016-06-07 DIAGNOSIS — Z1231 Encounter for screening mammogram for malignant neoplasm of breast: Secondary | ICD-10-CM

## 2016-06-10 DIAGNOSIS — M310 Hypersensitivity angiitis: Secondary | ICD-10-CM | POA: Diagnosis not present

## 2016-06-10 DIAGNOSIS — Z6824 Body mass index (BMI) 24.0-24.9, adult: Secondary | ICD-10-CM | POA: Diagnosis not present

## 2016-06-10 DIAGNOSIS — M3500 Sicca syndrome, unspecified: Secondary | ICD-10-CM | POA: Diagnosis not present

## 2016-06-10 DIAGNOSIS — Z79899 Other long term (current) drug therapy: Secondary | ICD-10-CM | POA: Diagnosis not present

## 2016-07-19 ENCOUNTER — Ambulatory Visit
Admission: RE | Admit: 2016-07-19 | Discharge: 2016-07-19 | Disposition: A | Discharge status: Home / Self Care | Payer: Medicare Other | Source: Ambulatory Visit | Attending: Family Medicine | Admitting: Family Medicine

## 2016-07-19 DIAGNOSIS — Z1231 Encounter for screening mammogram for malignant neoplasm of breast: Secondary | ICD-10-CM | POA: Diagnosis not present

## 2016-11-12 DIAGNOSIS — E785 Hyperlipidemia, unspecified: Secondary | ICD-10-CM | POA: Diagnosis not present

## 2016-11-12 DIAGNOSIS — Z5181 Encounter for therapeutic drug level monitoring: Secondary | ICD-10-CM | POA: Diagnosis not present

## 2016-11-12 DIAGNOSIS — D72820 Lymphocytosis (symptomatic): Secondary | ICD-10-CM | POA: Diagnosis not present

## 2016-11-12 DIAGNOSIS — Z0000 Encounter for general adult medical examination without abnormal findings: Secondary | ICD-10-CM | POA: Diagnosis not present

## 2016-11-12 DIAGNOSIS — E559 Vitamin D deficiency, unspecified: Secondary | ICD-10-CM | POA: Diagnosis not present

## 2016-11-22 DIAGNOSIS — H40023 Open angle with borderline findings, high risk, bilateral: Secondary | ICD-10-CM | POA: Diagnosis not present

## 2016-11-22 DIAGNOSIS — H2513 Age-related nuclear cataract, bilateral: Secondary | ICD-10-CM | POA: Diagnosis not present

## 2016-12-09 DIAGNOSIS — M3500 Sicca syndrome, unspecified: Secondary | ICD-10-CM | POA: Diagnosis not present

## 2016-12-09 DIAGNOSIS — Z79899 Other long term (current) drug therapy: Secondary | ICD-10-CM | POA: Diagnosis not present

## 2016-12-09 DIAGNOSIS — Z6824 Body mass index (BMI) 24.0-24.9, adult: Secondary | ICD-10-CM | POA: Diagnosis not present

## 2016-12-09 DIAGNOSIS — M310 Hypersensitivity angiitis: Secondary | ICD-10-CM | POA: Diagnosis not present

## 2016-12-09 DIAGNOSIS — M25551 Pain in right hip: Secondary | ICD-10-CM | POA: Diagnosis not present

## 2016-12-24 DIAGNOSIS — Z23 Encounter for immunization: Secondary | ICD-10-CM | POA: Diagnosis not present

## 2016-12-30 DIAGNOSIS — M8588 Other specified disorders of bone density and structure, other site: Secondary | ICD-10-CM | POA: Diagnosis not present

## 2016-12-30 DIAGNOSIS — E2839 Other primary ovarian failure: Secondary | ICD-10-CM | POA: Diagnosis not present

## 2017-01-18 DIAGNOSIS — L821 Other seborrheic keratosis: Secondary | ICD-10-CM | POA: Diagnosis not present

## 2017-01-18 DIAGNOSIS — Z85828 Personal history of other malignant neoplasm of skin: Secondary | ICD-10-CM | POA: Diagnosis not present

## 2017-01-18 DIAGNOSIS — D1801 Hemangioma of skin and subcutaneous tissue: Secondary | ICD-10-CM | POA: Diagnosis not present

## 2017-01-18 DIAGNOSIS — L603 Nail dystrophy: Secondary | ICD-10-CM | POA: Diagnosis not present

## 2017-01-18 DIAGNOSIS — D2362 Other benign neoplasm of skin of left upper limb, including shoulder: Secondary | ICD-10-CM | POA: Diagnosis not present

## 2017-06-09 DIAGNOSIS — Z79899 Other long term (current) drug therapy: Secondary | ICD-10-CM | POA: Diagnosis not present

## 2017-06-09 DIAGNOSIS — Z6824 Body mass index (BMI) 24.0-24.9, adult: Secondary | ICD-10-CM | POA: Diagnosis not present

## 2017-06-09 DIAGNOSIS — M3500 Sicca syndrome, unspecified: Secondary | ICD-10-CM | POA: Diagnosis not present

## 2017-06-09 DIAGNOSIS — M25551 Pain in right hip: Secondary | ICD-10-CM | POA: Diagnosis not present

## 2017-06-09 DIAGNOSIS — M310 Hypersensitivity angiitis: Secondary | ICD-10-CM | POA: Diagnosis not present

## 2017-06-10 ENCOUNTER — Other Ambulatory Visit: Payer: Self-pay | Admitting: Family Medicine

## 2017-06-10 DIAGNOSIS — Z1231 Encounter for screening mammogram for malignant neoplasm of breast: Secondary | ICD-10-CM

## 2017-07-21 ENCOUNTER — Ambulatory Visit
Admission: RE | Admit: 2017-07-21 | Discharge: 2017-07-21 | Disposition: A | Discharge status: Home / Self Care | Payer: Medicare Other | Source: Ambulatory Visit | Attending: Family Medicine | Admitting: Family Medicine

## 2017-07-21 DIAGNOSIS — Z1231 Encounter for screening mammogram for malignant neoplasm of breast: Secondary | ICD-10-CM | POA: Diagnosis not present

## 2017-10-27 DIAGNOSIS — H40023 Open angle with borderline findings, high risk, bilateral: Secondary | ICD-10-CM | POA: Diagnosis not present

## 2017-11-16 DIAGNOSIS — Z0000 Encounter for general adult medical examination without abnormal findings: Secondary | ICD-10-CM | POA: Diagnosis not present

## 2017-11-16 DIAGNOSIS — E785 Hyperlipidemia, unspecified: Secondary | ICD-10-CM | POA: Diagnosis not present

## 2017-11-16 DIAGNOSIS — Z5181 Encounter for therapeutic drug level monitoring: Secondary | ICD-10-CM | POA: Diagnosis not present

## 2017-11-16 DIAGNOSIS — M3500 Sicca syndrome, unspecified: Secondary | ICD-10-CM | POA: Diagnosis not present

## 2017-11-16 DIAGNOSIS — E559 Vitamin D deficiency, unspecified: Secondary | ICD-10-CM | POA: Diagnosis not present

## 2017-11-23 DIAGNOSIS — H2513 Age-related nuclear cataract, bilateral: Secondary | ICD-10-CM | POA: Diagnosis not present

## 2017-11-23 DIAGNOSIS — H40023 Open angle with borderline findings, high risk, bilateral: Secondary | ICD-10-CM | POA: Diagnosis not present

## 2017-11-23 DIAGNOSIS — H401231 Low-tension glaucoma, bilateral, mild stage: Secondary | ICD-10-CM | POA: Diagnosis not present

## 2017-12-08 DIAGNOSIS — M310 Hypersensitivity angiitis: Secondary | ICD-10-CM | POA: Diagnosis not present

## 2017-12-08 DIAGNOSIS — M25551 Pain in right hip: Secondary | ICD-10-CM | POA: Diagnosis not present

## 2017-12-08 DIAGNOSIS — Z79899 Other long term (current) drug therapy: Secondary | ICD-10-CM | POA: Diagnosis not present

## 2017-12-08 DIAGNOSIS — Z6824 Body mass index (BMI) 24.0-24.9, adult: Secondary | ICD-10-CM | POA: Diagnosis not present

## 2017-12-08 DIAGNOSIS — M3500 Sicca syndrome, unspecified: Secondary | ICD-10-CM | POA: Diagnosis not present

## 2017-12-08 DIAGNOSIS — Z711 Person with feared health complaint in whom no diagnosis is made: Secondary | ICD-10-CM | POA: Diagnosis not present

## 2017-12-21 DIAGNOSIS — H524 Presbyopia: Secondary | ICD-10-CM | POA: Diagnosis not present

## 2017-12-21 DIAGNOSIS — H401231 Low-tension glaucoma, bilateral, mild stage: Secondary | ICD-10-CM | POA: Diagnosis not present

## 2017-12-21 DIAGNOSIS — H5203 Hypermetropia, bilateral: Secondary | ICD-10-CM | POA: Diagnosis not present

## 2017-12-21 DIAGNOSIS — H52203 Unspecified astigmatism, bilateral: Secondary | ICD-10-CM | POA: Diagnosis not present

## 2017-12-21 DIAGNOSIS — H2513 Age-related nuclear cataract, bilateral: Secondary | ICD-10-CM | POA: Diagnosis not present

## 2018-01-03 DIAGNOSIS — Z23 Encounter for immunization: Secondary | ICD-10-CM | POA: Diagnosis not present

## 2018-01-17 DIAGNOSIS — D235 Other benign neoplasm of skin of trunk: Secondary | ICD-10-CM | POA: Diagnosis not present

## 2018-01-17 DIAGNOSIS — L820 Inflamed seborrheic keratosis: Secondary | ICD-10-CM | POA: Diagnosis not present

## 2018-01-17 DIAGNOSIS — L821 Other seborrheic keratosis: Secondary | ICD-10-CM | POA: Diagnosis not present

## 2018-03-22 DIAGNOSIS — H2513 Age-related nuclear cataract, bilateral: Secondary | ICD-10-CM | POA: Diagnosis not present

## 2018-03-22 DIAGNOSIS — H401231 Low-tension glaucoma, bilateral, mild stage: Secondary | ICD-10-CM | POA: Diagnosis not present

## 2018-03-22 DIAGNOSIS — H04129 Dry eye syndrome of unspecified lacrimal gland: Secondary | ICD-10-CM | POA: Diagnosis not present

## 2018-06-05 DIAGNOSIS — F331 Major depressive disorder, recurrent, moderate: Secondary | ICD-10-CM | POA: Diagnosis not present

## 2018-06-13 ENCOUNTER — Other Ambulatory Visit: Payer: Self-pay | Admitting: Family Medicine

## 2018-06-13 DIAGNOSIS — Z1231 Encounter for screening mammogram for malignant neoplasm of breast: Secondary | ICD-10-CM

## 2018-06-16 DIAGNOSIS — H2513 Age-related nuclear cataract, bilateral: Secondary | ICD-10-CM | POA: Diagnosis not present

## 2018-06-26 DIAGNOSIS — F331 Major depressive disorder, recurrent, moderate: Secondary | ICD-10-CM | POA: Diagnosis not present

## 2018-07-17 DIAGNOSIS — H2513 Age-related nuclear cataract, bilateral: Secondary | ICD-10-CM | POA: Diagnosis not present

## 2018-07-24 ENCOUNTER — Ambulatory Visit: Payer: Medicare Other

## 2018-08-07 DIAGNOSIS — D485 Neoplasm of uncertain behavior of skin: Secondary | ICD-10-CM | POA: Diagnosis not present

## 2018-08-07 DIAGNOSIS — L820 Inflamed seborrheic keratosis: Secondary | ICD-10-CM | POA: Diagnosis not present

## 2018-08-30 ENCOUNTER — Ambulatory Visit: Payer: Medicare Other

## 2018-10-06 DIAGNOSIS — Z1159 Encounter for screening for other viral diseases: Secondary | ICD-10-CM | POA: Diagnosis not present

## 2018-10-06 DIAGNOSIS — H2513 Age-related nuclear cataract, bilateral: Secondary | ICD-10-CM | POA: Diagnosis not present

## 2018-10-06 DIAGNOSIS — Z01812 Encounter for preprocedural laboratory examination: Secondary | ICD-10-CM | POA: Diagnosis not present

## 2018-10-09 DIAGNOSIS — H401231 Low-tension glaucoma, bilateral, mild stage: Secondary | ICD-10-CM | POA: Diagnosis not present

## 2018-10-09 DIAGNOSIS — H2513 Age-related nuclear cataract, bilateral: Secondary | ICD-10-CM | POA: Diagnosis not present

## 2018-10-11 ENCOUNTER — Other Ambulatory Visit: Payer: Self-pay

## 2018-10-11 ENCOUNTER — Ambulatory Visit
Admission: RE | Admit: 2018-10-11 | Discharge: 2018-10-11 | Disposition: A | Discharge status: Home / Self Care | Payer: Medicare Other | Source: Ambulatory Visit | Attending: Family Medicine | Admitting: Family Medicine

## 2018-10-11 DIAGNOSIS — Z1231 Encounter for screening mammogram for malignant neoplasm of breast: Secondary | ICD-10-CM

## 2018-10-12 DIAGNOSIS — H25812 Combined forms of age-related cataract, left eye: Secondary | ICD-10-CM | POA: Diagnosis not present

## 2018-10-12 DIAGNOSIS — H25813 Combined forms of age-related cataract, bilateral: Secondary | ICD-10-CM | POA: Diagnosis not present

## 2018-10-25 DIAGNOSIS — Z01812 Encounter for preprocedural laboratory examination: Secondary | ICD-10-CM | POA: Diagnosis not present

## 2018-10-25 DIAGNOSIS — H25811 Combined forms of age-related cataract, right eye: Secondary | ICD-10-CM | POA: Diagnosis not present

## 2018-10-25 DIAGNOSIS — Z1159 Encounter for screening for other viral diseases: Secondary | ICD-10-CM | POA: Diagnosis not present

## 2018-10-26 DIAGNOSIS — H25811 Combined forms of age-related cataract, right eye: Secondary | ICD-10-CM | POA: Diagnosis not present

## 2018-10-26 DIAGNOSIS — R0683 Snoring: Secondary | ICD-10-CM | POA: Diagnosis not present

## 2018-12-08 DIAGNOSIS — E559 Vitamin D deficiency, unspecified: Secondary | ICD-10-CM | POA: Diagnosis not present

## 2018-12-08 DIAGNOSIS — E785 Hyperlipidemia, unspecified: Secondary | ICD-10-CM | POA: Diagnosis not present

## 2018-12-08 DIAGNOSIS — Z5181 Encounter for therapeutic drug level monitoring: Secondary | ICD-10-CM | POA: Diagnosis not present

## 2018-12-08 DIAGNOSIS — D72820 Lymphocytosis (symptomatic): Secondary | ICD-10-CM | POA: Diagnosis not present

## 2018-12-08 DIAGNOSIS — M3500 Sicca syndrome, unspecified: Secondary | ICD-10-CM | POA: Diagnosis not present

## 2018-12-11 DIAGNOSIS — Z1211 Encounter for screening for malignant neoplasm of colon: Secondary | ICD-10-CM | POA: Diagnosis not present

## 2018-12-11 DIAGNOSIS — M3500 Sicca syndrome, unspecified: Secondary | ICD-10-CM | POA: Diagnosis not present

## 2018-12-11 DIAGNOSIS — Z0000 Encounter for general adult medical examination without abnormal findings: Secondary | ICD-10-CM | POA: Diagnosis not present

## 2019-01-17 DIAGNOSIS — Z23 Encounter for immunization: Secondary | ICD-10-CM | POA: Diagnosis not present

## 2019-01-18 DIAGNOSIS — M310 Hypersensitivity angiitis: Secondary | ICD-10-CM | POA: Diagnosis not present

## 2019-01-18 DIAGNOSIS — Z79899 Other long term (current) drug therapy: Secondary | ICD-10-CM | POA: Diagnosis not present

## 2019-01-18 DIAGNOSIS — M25551 Pain in right hip: Secondary | ICD-10-CM | POA: Diagnosis not present

## 2019-01-18 DIAGNOSIS — M3500 Sicca syndrome, unspecified: Secondary | ICD-10-CM | POA: Diagnosis not present

## 2019-01-18 DIAGNOSIS — Z23 Encounter for immunization: Secondary | ICD-10-CM | POA: Diagnosis not present

## 2019-01-18 DIAGNOSIS — Z6824 Body mass index (BMI) 24.0-24.9, adult: Secondary | ICD-10-CM | POA: Diagnosis not present

## 2019-01-18 DIAGNOSIS — Z711 Person with feared health complaint in whom no diagnosis is made: Secondary | ICD-10-CM | POA: Diagnosis not present

## 2019-01-23 DIAGNOSIS — H02403 Unspecified ptosis of bilateral eyelids: Secondary | ICD-10-CM | POA: Diagnosis not present

## 2019-01-25 DIAGNOSIS — L821 Other seborrheic keratosis: Secondary | ICD-10-CM | POA: Diagnosis not present

## 2019-01-25 DIAGNOSIS — D1801 Hemangioma of skin and subcutaneous tissue: Secondary | ICD-10-CM | POA: Diagnosis not present

## 2019-01-25 DIAGNOSIS — D225 Melanocytic nevi of trunk: Secondary | ICD-10-CM | POA: Diagnosis not present

## 2019-02-15 DIAGNOSIS — Z20828 Contact with and (suspected) exposure to other viral communicable diseases: Secondary | ICD-10-CM | POA: Diagnosis not present

## 2019-02-15 DIAGNOSIS — H02403 Unspecified ptosis of bilateral eyelids: Secondary | ICD-10-CM | POA: Diagnosis not present

## 2019-02-15 DIAGNOSIS — Z01812 Encounter for preprocedural laboratory examination: Secondary | ICD-10-CM | POA: Diagnosis not present

## 2019-02-21 DIAGNOSIS — F418 Other specified anxiety disorders: Secondary | ICD-10-CM | POA: Diagnosis not present

## 2019-02-21 DIAGNOSIS — H02403 Unspecified ptosis of bilateral eyelids: Secondary | ICD-10-CM | POA: Diagnosis not present

## 2019-03-16 DIAGNOSIS — H26493 Other secondary cataract, bilateral: Secondary | ICD-10-CM | POA: Diagnosis not present

## 2019-05-23 DIAGNOSIS — H401231 Low-tension glaucoma, bilateral, mild stage: Secondary | ICD-10-CM | POA: Diagnosis not present

## 2019-05-23 DIAGNOSIS — Z961 Presence of intraocular lens: Secondary | ICD-10-CM | POA: Diagnosis not present

## 2019-05-24 DIAGNOSIS — Z9842 Cataract extraction status, left eye: Secondary | ICD-10-CM | POA: Diagnosis not present

## 2019-05-24 DIAGNOSIS — Z961 Presence of intraocular lens: Secondary | ICD-10-CM | POA: Diagnosis not present

## 2019-06-16 ENCOUNTER — Ambulatory Visit: Payer: Medicare Other | Attending: Internal Medicine

## 2019-06-16 DIAGNOSIS — Z23 Encounter for immunization: Secondary | ICD-10-CM | POA: Insufficient documentation

## 2019-06-16 NOTE — Progress Notes (Signed)
   Covid-19 Vaccination Clinic  Name:  Diana Haley    MRN: [id number] DOB: 1946-03-14  06/16/2019  [name] was observed post Covid-19 immunization for 15 minutes without incidence. She was provided with Vaccine Information Sheet and instruction to access the V-Safe system.   [name] was instructed to call 911 with any severe reactions post vaccine: Marland Kitchen Difficulty breathing  . Swelling of your face and throat  . A fast heartbeat  . A bad rash all over your body  . Dizziness and weakness    Immunizations Administered    Name Date Dose VIS Date Route   Pfizer COVID-19 Vaccine 06/16/2019  2:11 PM 0.3 mL 04/13/2019 Intramuscular   Manufacturer: [place]   Lot: [id number]   [place]: [id number]

## 2019-06-18 ENCOUNTER — Other Ambulatory Visit: Payer: Self-pay | Admitting: Family Medicine

## 2019-06-18 DIAGNOSIS — Z1231 Encounter for screening mammogram for malignant neoplasm of breast: Secondary | ICD-10-CM

## 2019-07-09 ENCOUNTER — Ambulatory Visit: Payer: Medicare Other | Attending: Internal Medicine

## 2019-07-09 DIAGNOSIS — Z23 Encounter for immunization: Secondary | ICD-10-CM | POA: Insufficient documentation

## 2019-07-09 NOTE — Progress Notes (Signed)
   Covid-19 Vaccination Clinic  Name:  COLLYN OWYANG    MRN: [id number] DOB: 06/19/1945  07/09/2019  Ms. Pucillo was observed post Covid-19 immunization for 15 minutes without incident. She was provided with Vaccine Information Sheet and instruction to access the V-Safe system.   Ms. Rotar was instructed to call 911 with any severe reactions post vaccine: Marland Kitchen Difficulty breathing  . Swelling of face and throat  . A fast heartbeat  . A bad rash all over body  . Dizziness and weakness   Immunizations Administered    Name Date Dose VIS Date Route   Pfizer COVID-19 Vaccine 07/09/2019 11:50 AM 0.3 mL 04/13/2019 Intramuscular   Manufacturer: [place]   Lot: [id number]   [place]: [id number]

## 2019-07-16 DIAGNOSIS — S0011XA Contusion of right eyelid and periocular area, initial encounter: Secondary | ICD-10-CM | POA: Diagnosis not present

## 2019-07-16 DIAGNOSIS — S058X1A Other injuries of right eye and orbit, initial encounter: Secondary | ICD-10-CM | POA: Diagnosis not present

## 2019-07-24 DIAGNOSIS — Z9889 Other specified postprocedural states: Secondary | ICD-10-CM | POA: Diagnosis not present

## 2019-07-24 DIAGNOSIS — Z48810 Encounter for surgical aftercare following surgery on the sense organs: Secondary | ICD-10-CM | POA: Diagnosis not present

## 2019-07-24 DIAGNOSIS — H02403 Unspecified ptosis of bilateral eyelids: Secondary | ICD-10-CM | POA: Diagnosis not present

## 2019-10-12 ENCOUNTER — Other Ambulatory Visit: Payer: Self-pay

## 2019-10-12 ENCOUNTER — Ambulatory Visit
Admission: RE | Admit: 2019-10-12 | Discharge: 2019-10-12 | Disposition: A | Discharge status: Home / Self Care | Payer: Medicare Other | Source: Ambulatory Visit | Attending: Family Medicine | Admitting: Family Medicine

## 2019-10-12 DIAGNOSIS — Z1231 Encounter for screening mammogram for malignant neoplasm of breast: Secondary | ICD-10-CM

## 2019-12-24 DIAGNOSIS — E559 Vitamin D deficiency, unspecified: Secondary | ICD-10-CM | POA: Diagnosis not present

## 2019-12-24 DIAGNOSIS — D72820 Lymphocytosis (symptomatic): Secondary | ICD-10-CM | POA: Diagnosis not present

## 2019-12-24 DIAGNOSIS — E785 Hyperlipidemia, unspecified: Secondary | ICD-10-CM | POA: Diagnosis not present

## 2019-12-24 DIAGNOSIS — Z0000 Encounter for general adult medical examination without abnormal findings: Secondary | ICD-10-CM | POA: Diagnosis not present

## 2019-12-24 DIAGNOSIS — M3500 Sicca syndrome, unspecified: Secondary | ICD-10-CM | POA: Diagnosis not present

## 2019-12-24 DIAGNOSIS — Z5181 Encounter for therapeutic drug level monitoring: Secondary | ICD-10-CM | POA: Diagnosis not present

## 2019-12-28 DIAGNOSIS — Z9889 Other specified postprocedural states: Secondary | ICD-10-CM | POA: Diagnosis not present

## 2019-12-28 DIAGNOSIS — Z961 Presence of intraocular lens: Secondary | ICD-10-CM | POA: Diagnosis not present

## 2019-12-28 DIAGNOSIS — Z9841 Cataract extraction status, right eye: Secondary | ICD-10-CM | POA: Diagnosis not present

## 2019-12-28 DIAGNOSIS — H401231 Low-tension glaucoma, bilateral, mild stage: Secondary | ICD-10-CM | POA: Diagnosis not present

## 2019-12-28 DIAGNOSIS — Z9842 Cataract extraction status, left eye: Secondary | ICD-10-CM | POA: Diagnosis not present

## 2020-01-17 DIAGNOSIS — M25551 Pain in right hip: Secondary | ICD-10-CM | POA: Diagnosis not present

## 2020-01-17 DIAGNOSIS — M3500 Sicca syndrome, unspecified: Secondary | ICD-10-CM | POA: Diagnosis not present

## 2020-01-17 DIAGNOSIS — Z6824 Body mass index (BMI) 24.0-24.9, adult: Secondary | ICD-10-CM | POA: Diagnosis not present

## 2020-01-17 DIAGNOSIS — M310 Hypersensitivity angiitis: Secondary | ICD-10-CM | POA: Diagnosis not present

## 2020-01-17 DIAGNOSIS — Z79899 Other long term (current) drug therapy: Secondary | ICD-10-CM | POA: Diagnosis not present

## 2020-01-23 DIAGNOSIS — Z961 Presence of intraocular lens: Secondary | ICD-10-CM | POA: Diagnosis not present

## 2020-01-23 DIAGNOSIS — F329 Major depressive disorder, single episode, unspecified: Secondary | ICD-10-CM | POA: Diagnosis not present

## 2020-01-23 DIAGNOSIS — H401231 Low-tension glaucoma, bilateral, mild stage: Secondary | ICD-10-CM | POA: Diagnosis not present

## 2020-01-24 DIAGNOSIS — Z23 Encounter for immunization: Secondary | ICD-10-CM | POA: Diagnosis not present

## 2020-01-28 DIAGNOSIS — B353 Tinea pedis: Secondary | ICD-10-CM | POA: Diagnosis not present

## 2020-01-28 DIAGNOSIS — L438 Other lichen planus: Secondary | ICD-10-CM | POA: Diagnosis not present

## 2020-01-28 DIAGNOSIS — L309 Dermatitis, unspecified: Secondary | ICD-10-CM | POA: Diagnosis not present

## 2020-01-28 DIAGNOSIS — L821 Other seborrheic keratosis: Secondary | ICD-10-CM | POA: Diagnosis not present

## 2020-04-29 DIAGNOSIS — Z23 Encounter for immunization: Secondary | ICD-10-CM | POA: Diagnosis not present

## 2020-07-24 DIAGNOSIS — H401231 Low-tension glaucoma, bilateral, mild stage: Secondary | ICD-10-CM | POA: Diagnosis not present

## 2020-08-06 DIAGNOSIS — H04123 Dry eye syndrome of bilateral lacrimal glands: Secondary | ICD-10-CM | POA: Diagnosis not present

## 2020-08-06 DIAGNOSIS — H401231 Low-tension glaucoma, bilateral, mild stage: Secondary | ICD-10-CM | POA: Diagnosis not present

## 2020-09-11 ENCOUNTER — Other Ambulatory Visit: Payer: Self-pay | Admitting: Family Medicine

## 2020-09-11 DIAGNOSIS — Z1231 Encounter for screening mammogram for malignant neoplasm of breast: Secondary | ICD-10-CM

## 2020-09-24 DIAGNOSIS — H401231 Low-tension glaucoma, bilateral, mild stage: Secondary | ICD-10-CM | POA: Diagnosis not present

## 2020-11-06 ENCOUNTER — Other Ambulatory Visit: Payer: Self-pay

## 2020-11-06 ENCOUNTER — Ambulatory Visit
Admission: RE | Admit: 2020-11-06 | Discharge: 2020-11-06 | Disposition: A | Discharge status: Home / Self Care | Payer: Medicare Other | Source: Ambulatory Visit | Attending: Family Medicine | Admitting: Family Medicine

## 2020-11-06 DIAGNOSIS — Z1231 Encounter for screening mammogram for malignant neoplasm of breast: Secondary | ICD-10-CM | POA: Diagnosis not present

## 2021-01-12 DIAGNOSIS — E785 Hyperlipidemia, unspecified: Secondary | ICD-10-CM | POA: Diagnosis not present

## 2021-01-12 DIAGNOSIS — M3500 Sicca syndrome, unspecified: Secondary | ICD-10-CM | POA: Diagnosis not present

## 2021-01-12 DIAGNOSIS — Z0000 Encounter for general adult medical examination without abnormal findings: Secondary | ICD-10-CM | POA: Diagnosis not present

## 2021-01-12 DIAGNOSIS — Z5181 Encounter for therapeutic drug level monitoring: Secondary | ICD-10-CM | POA: Diagnosis not present

## 2021-01-12 DIAGNOSIS — F331 Major depressive disorder, recurrent, moderate: Secondary | ICD-10-CM | POA: Diagnosis not present

## 2021-01-12 DIAGNOSIS — D72820 Lymphocytosis (symptomatic): Secondary | ICD-10-CM | POA: Diagnosis not present

## 2021-01-12 DIAGNOSIS — Z23 Encounter for immunization: Secondary | ICD-10-CM | POA: Diagnosis not present

## 2021-01-14 ENCOUNTER — Other Ambulatory Visit: Payer: Self-pay | Admitting: Family Medicine

## 2021-01-14 DIAGNOSIS — E2839 Other primary ovarian failure: Secondary | ICD-10-CM

## 2021-01-21 DIAGNOSIS — Z6824 Body mass index (BMI) 24.0-24.9, adult: Secondary | ICD-10-CM | POA: Diagnosis not present

## 2021-01-21 DIAGNOSIS — M3500 Sicca syndrome, unspecified: Secondary | ICD-10-CM | POA: Diagnosis not present

## 2021-01-21 DIAGNOSIS — M25551 Pain in right hip: Secondary | ICD-10-CM | POA: Diagnosis not present

## 2021-01-21 DIAGNOSIS — M310 Hypersensitivity angiitis: Secondary | ICD-10-CM | POA: Diagnosis not present

## 2021-01-21 DIAGNOSIS — Z79899 Other long term (current) drug therapy: Secondary | ICD-10-CM | POA: Diagnosis not present

## 2021-01-22 DIAGNOSIS — Z23 Encounter for immunization: Secondary | ICD-10-CM | POA: Diagnosis not present

## 2021-01-28 DIAGNOSIS — L821 Other seborrheic keratosis: Secondary | ICD-10-CM | POA: Diagnosis not present

## 2021-01-28 DIAGNOSIS — D225 Melanocytic nevi of trunk: Secondary | ICD-10-CM | POA: Diagnosis not present

## 2021-02-09 DIAGNOSIS — Z961 Presence of intraocular lens: Secondary | ICD-10-CM | POA: Diagnosis not present

## 2021-02-09 DIAGNOSIS — H401231 Low-tension glaucoma, bilateral, mild stage: Secondary | ICD-10-CM | POA: Diagnosis not present

## 2021-03-06 DIAGNOSIS — K5900 Constipation, unspecified: Secondary | ICD-10-CM | POA: Diagnosis not present

## 2021-03-06 DIAGNOSIS — F331 Major depressive disorder, recurrent, moderate: Secondary | ICD-10-CM | POA: Diagnosis not present

## 2021-05-08 ENCOUNTER — Ambulatory Visit: Payer: Self-pay

## 2021-05-08 NOTE — Telephone Encounter (Signed)
°  Chief Complaint: arm pain Symptoms: arm pain 8-9/10, weakness in hand and unable to grip Frequency: 2-3 days Pertinent Negatives: NA Disposition: [] ED /[x] Urgent Care (no appt availability in office) / [] Appointment(In office/virtual)/ []  [facility]/ [] Home Care/ [] Refused Recommended Disposition /[] [facility]/ []  Follow-up with PCP Additional Notes: Pt states she is concerned about pinched nerve or what's causing her pain in her arm. She asked if she can wait and see her provider Monday and advised pt UC this evening or in the morning if possible to be seen. Pt was given UC location and phone #s.    Reason for Disposition  Weakness (i.e., loss of strength) in hand or fingers  (Exception: not truly weak; hand feels weak because of pain)  Answer Assessment - Initial Assessment Questions 1. ONSET: "When did the pain start?"    2-3 days 2. LOCATION: "Where is the pain located?"     R arm above the elbow radiates to back of shoulder 3. PAIN: "How bad is the pain?" (Scale 1-10; or mild, moderate, severe)   - MILD (1-3): doesn't interfere with normal activities   - MODERATE (4-7): interferes with normal activities (e.g., work or school) or awakens from sleep   - SEVERE (8-10): excruciating pain, unable to do any normal activities, unable to hold a cup of water     8-9 currently 4. WORK OR EXERCISE: "Has there been any recent work or exercise that involved this part of the body?"     No 5. CAUSE: "What do you think is causing the arm pain?"     Unsure if pinched nerve or not 6. OTHER SYMPTOMS: "Do you have any other symptoms?" (e.g., neck pain, swelling, rash, fever, numbness, weakness)     Weakness in hand, unable to grip as normal.  Protocols used: Arm Pain-A-AH

## 2021-05-12 DIAGNOSIS — M25519 Pain in unspecified shoulder: Secondary | ICD-10-CM | POA: Diagnosis not present

## 2021-05-12 DIAGNOSIS — K5900 Constipation, unspecified: Secondary | ICD-10-CM | POA: Diagnosis not present

## 2021-05-12 DIAGNOSIS — R21 Rash and other nonspecific skin eruption: Secondary | ICD-10-CM | POA: Diagnosis not present

## 2021-05-26 DIAGNOSIS — M25511 Pain in right shoulder: Secondary | ICD-10-CM | POA: Diagnosis not present

## 2021-07-03 ENCOUNTER — Other Ambulatory Visit: Payer: Medicare Other

## 2021-08-13 DIAGNOSIS — Z961 Presence of intraocular lens: Secondary | ICD-10-CM | POA: Diagnosis not present

## 2021-08-13 DIAGNOSIS — H401232 Low-tension glaucoma, bilateral, moderate stage: Secondary | ICD-10-CM | POA: Diagnosis not present

## 2021-09-18 ENCOUNTER — Other Ambulatory Visit: Payer: Self-pay | Admitting: Family Medicine

## 2021-09-18 DIAGNOSIS — E2839 Other primary ovarian failure: Secondary | ICD-10-CM

## 2021-10-08 DIAGNOSIS — L84 Corns and callosities: Secondary | ICD-10-CM | POA: Diagnosis not present

## 2021-10-08 DIAGNOSIS — B351 Tinea unguium: Secondary | ICD-10-CM | POA: Diagnosis not present

## 2021-10-09 ENCOUNTER — Other Ambulatory Visit: Payer: Self-pay | Admitting: Family Medicine

## 2021-10-09 DIAGNOSIS — Z1231 Encounter for screening mammogram for malignant neoplasm of breast: Secondary | ICD-10-CM

## 2021-10-12 DIAGNOSIS — H40023 Open angle with borderline findings, high risk, bilateral: Secondary | ICD-10-CM | POA: Diagnosis not present

## 2021-11-10 ENCOUNTER — Ambulatory Visit
Admission: RE | Admit: 2021-11-10 | Discharge: 2021-11-10 | Disposition: A | Discharge status: Home / Self Care | Payer: Medicare Other | Source: Ambulatory Visit | Attending: Family Medicine | Admitting: Family Medicine

## 2021-11-10 DIAGNOSIS — Z1231 Encounter for screening mammogram for malignant neoplasm of breast: Secondary | ICD-10-CM

## 2021-12-02 IMAGING — MG DIGITAL SCREENING BREAST BILAT IMPLANT W/ TOMO W/ CAD
9 of 12 series · 9 of 28 positions shown · non-contrast
Comparison: Previous exam(s).

CLINICAL DATA: Screening.

EXAM:
DIGITAL SCREENING BILATERAL MAMMOGRAM WITH IMPLANTS, CAD AND
TOMOSYNTHESIS
TECHNIQUE: Bilateral screening digital craniocaudal and mediolateral oblique
mammograms were obtained. Bilateral screening digital breast
tomosynthesis was performed. The images were evaluated with
computer-aided detection. Standard and/or implant displaced views
were performed.

[L CC]
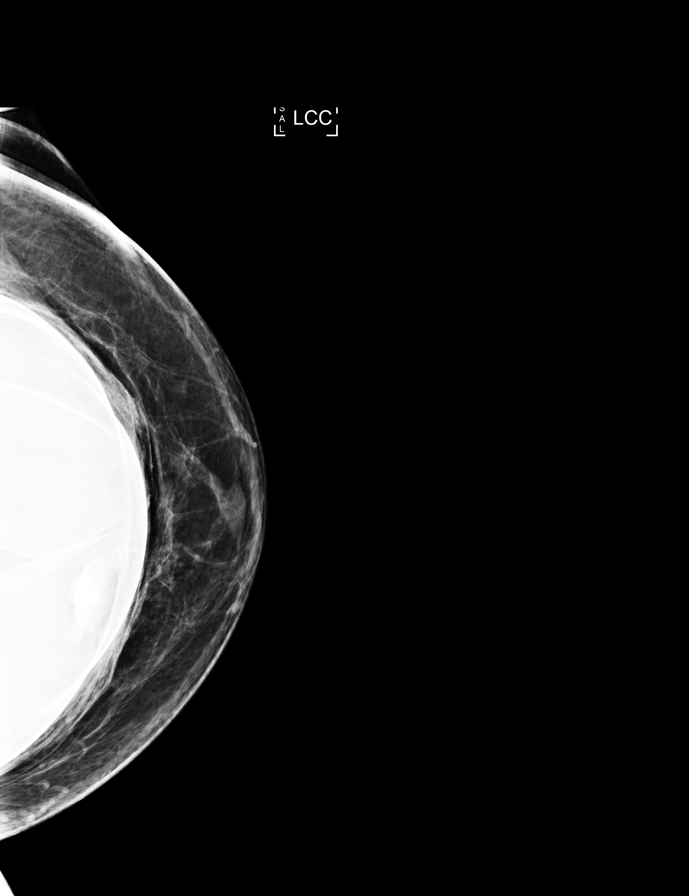

[L MLO]
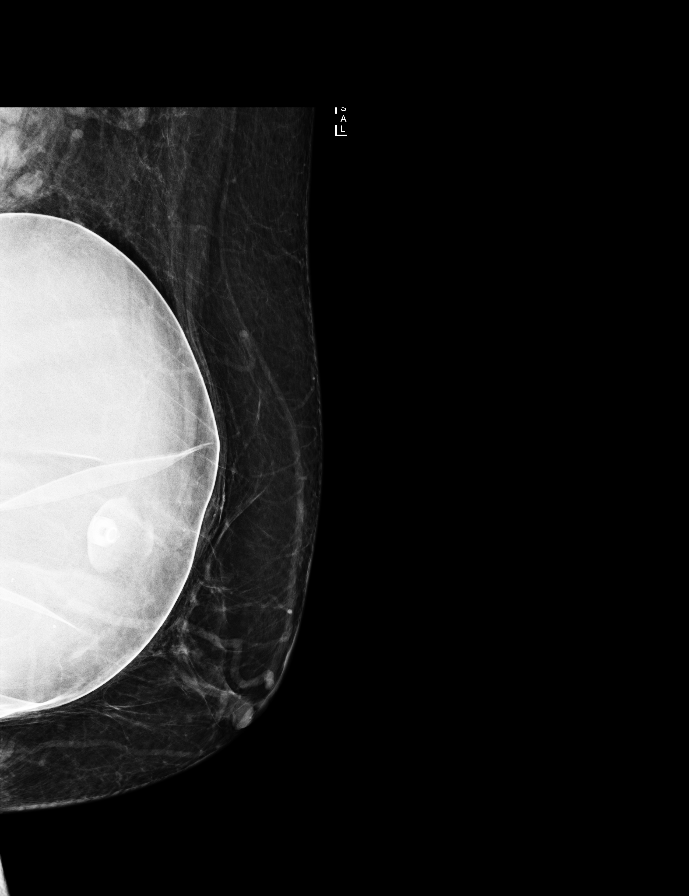

[R CC]
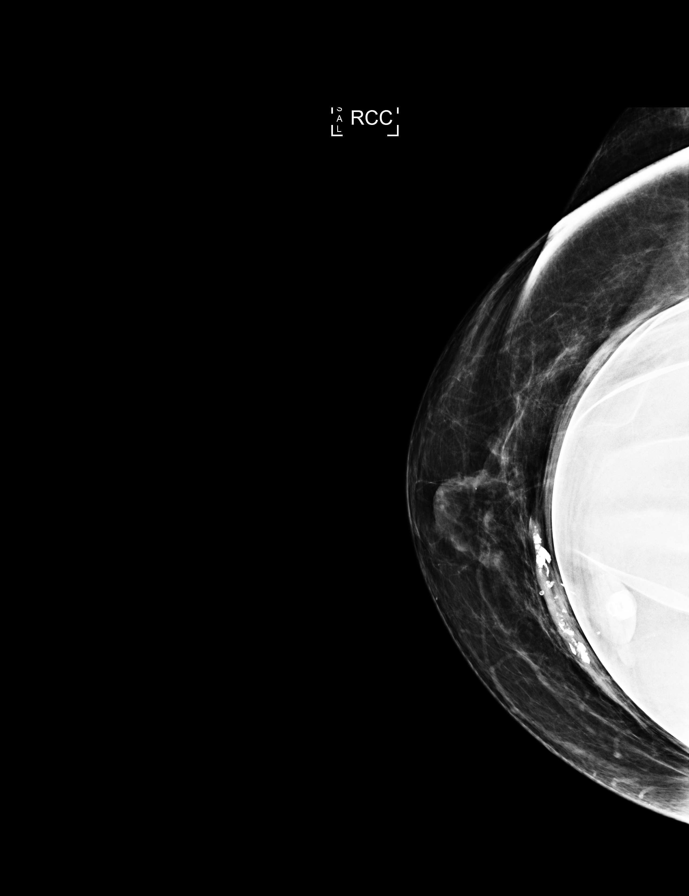

[R MLO]
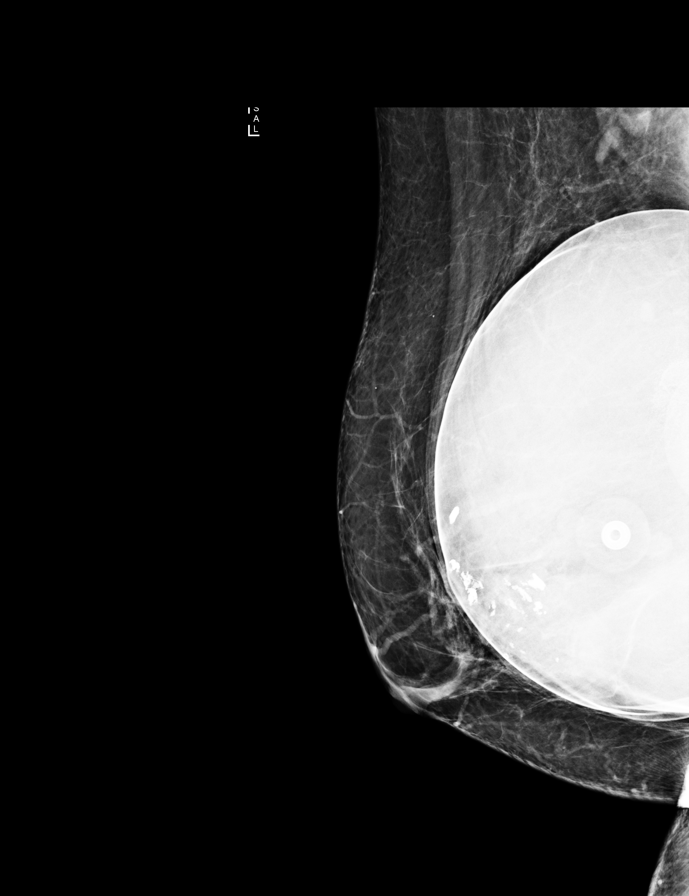

[R MLO synth-2D]
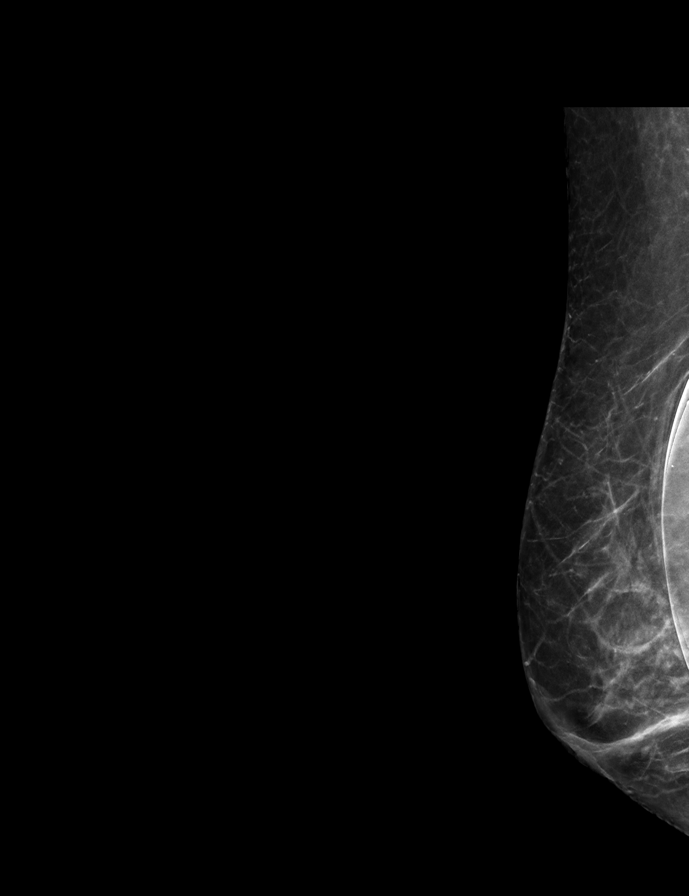

[L MLO synth-2D]
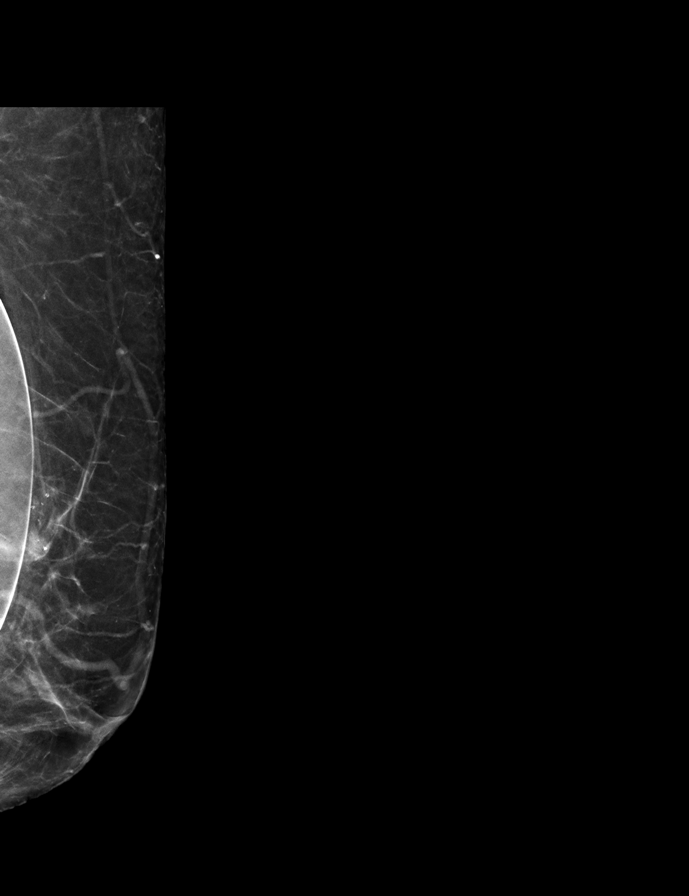

[L CC synth-2D]
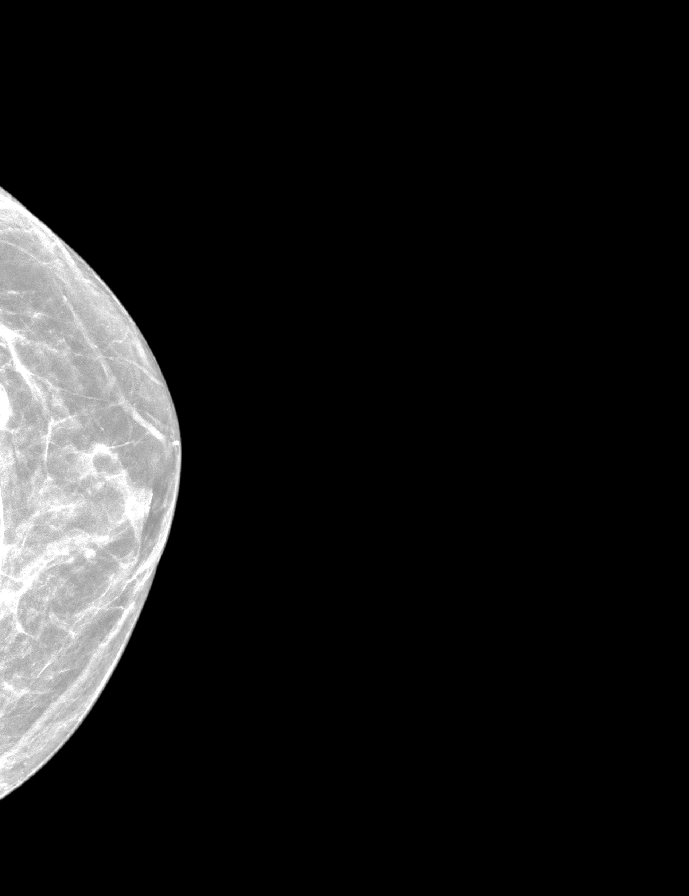

[R CC synth-2D]
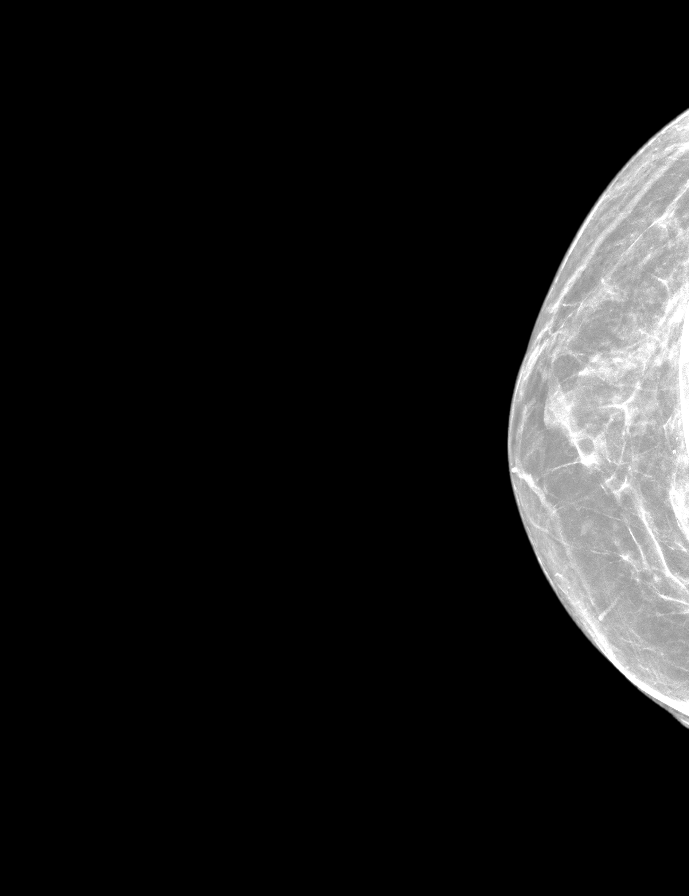

[R CCID BREAST TOMOSYNTHESIS IMAGE tomo · tomo slice 27/52.0]
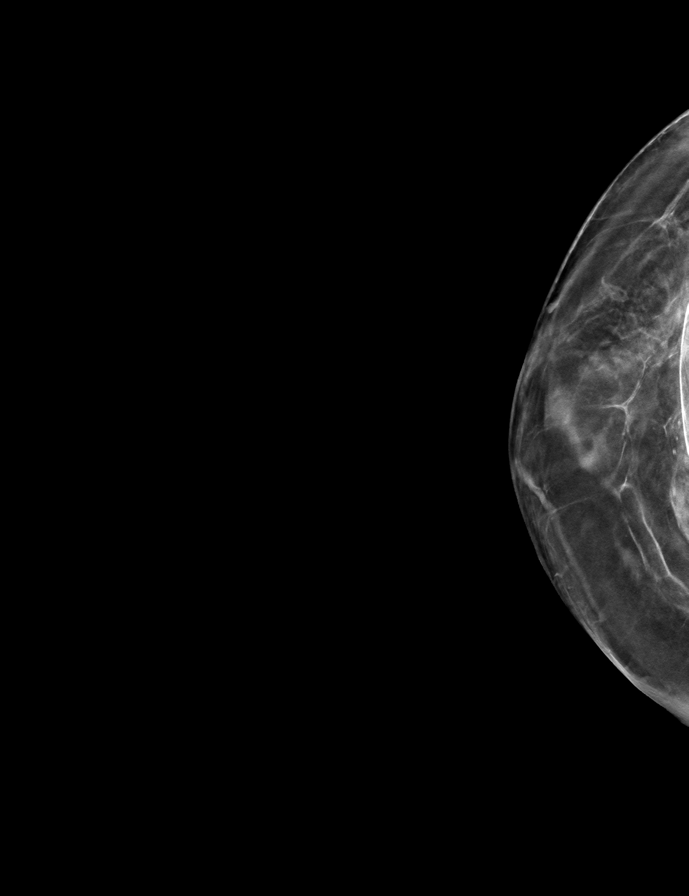

[9 of 28 positions shown; findings below may reference images not displayed]

ACR Breast Density Category b: There are scattered areas of
fibroglandular density.
FINDINGS: The patient has retropectoral implants. There are no findings
suspicious for malignancy.
IMPRESSION: No mammographic evidence of malignancy. A result letter of this
screening mammogram will be mailed directly to the patient.

RECOMMENDATION:
Screening mammogram in one year. (Code:SE-S-JMG)

BI-RADS CATEGORY  1:  Negative.

## 2021-12-04 ENCOUNTER — Ambulatory Visit
Admission: RE | Admit: 2021-12-04 | Discharge: 2021-12-04 | Disposition: A | Discharge status: Home / Self Care | Payer: Medicare Other | Source: Ambulatory Visit | Attending: Family Medicine | Admitting: Family Medicine

## 2021-12-04 DIAGNOSIS — M8589 Other specified disorders of bone density and structure, multiple sites: Secondary | ICD-10-CM | POA: Diagnosis not present

## 2021-12-04 DIAGNOSIS — E2839 Other primary ovarian failure: Secondary | ICD-10-CM

## 2021-12-04 DIAGNOSIS — Z780 Asymptomatic menopausal state: Secondary | ICD-10-CM | POA: Diagnosis not present

## 2022-01-26 DIAGNOSIS — B351 Tinea unguium: Secondary | ICD-10-CM | POA: Diagnosis not present

## 2022-01-26 DIAGNOSIS — D2261 Melanocytic nevi of right upper limb, including shoulder: Secondary | ICD-10-CM | POA: Diagnosis not present

## 2022-01-26 DIAGNOSIS — L820 Inflamed seborrheic keratosis: Secondary | ICD-10-CM | POA: Diagnosis not present

## 2022-01-26 DIAGNOSIS — D225 Melanocytic nevi of trunk: Secondary | ICD-10-CM | POA: Diagnosis not present

## 2022-01-26 DIAGNOSIS — D2262 Melanocytic nevi of left upper limb, including shoulder: Secondary | ICD-10-CM | POA: Diagnosis not present

## 2022-01-26 DIAGNOSIS — L821 Other seborrheic keratosis: Secondary | ICD-10-CM | POA: Diagnosis not present

## 2022-01-28 DIAGNOSIS — Z5181 Encounter for therapeutic drug level monitoring: Secondary | ICD-10-CM | POA: Diagnosis not present

## 2022-01-28 DIAGNOSIS — E785 Hyperlipidemia, unspecified: Secondary | ICD-10-CM | POA: Diagnosis not present

## 2022-01-28 DIAGNOSIS — F331 Major depressive disorder, recurrent, moderate: Secondary | ICD-10-CM | POA: Diagnosis not present

## 2022-01-28 DIAGNOSIS — Z0000 Encounter for general adult medical examination without abnormal findings: Secondary | ICD-10-CM | POA: Diagnosis not present

## 2022-01-28 DIAGNOSIS — R5383 Other fatigue: Secondary | ICD-10-CM | POA: Diagnosis not present

## 2022-02-04 DIAGNOSIS — M310 Hypersensitivity angiitis: Secondary | ICD-10-CM | POA: Diagnosis not present

## 2022-02-04 DIAGNOSIS — E663 Overweight: Secondary | ICD-10-CM | POA: Diagnosis not present

## 2022-02-04 DIAGNOSIS — Z79899 Other long term (current) drug therapy: Secondary | ICD-10-CM | POA: Diagnosis not present

## 2022-02-04 DIAGNOSIS — M3500 Sicca syndrome, unspecified: Secondary | ICD-10-CM | POA: Diagnosis not present

## 2022-02-04 DIAGNOSIS — Z6825 Body mass index (BMI) 25.0-25.9, adult: Secondary | ICD-10-CM | POA: Diagnosis not present

## 2022-02-04 DIAGNOSIS — M25551 Pain in right hip: Secondary | ICD-10-CM | POA: Diagnosis not present

## 2022-02-08 DIAGNOSIS — H40023 Open angle with borderline findings, high risk, bilateral: Secondary | ICD-10-CM | POA: Diagnosis not present

## 2022-02-12 DIAGNOSIS — Z23 Encounter for immunization: Secondary | ICD-10-CM | POA: Diagnosis not present

## 2022-02-27 DIAGNOSIS — B349 Viral infection, unspecified: Secondary | ICD-10-CM | POA: Diagnosis not present

## 2022-02-27 DIAGNOSIS — R5383 Other fatigue: Secondary | ICD-10-CM | POA: Diagnosis not present

## 2022-02-27 DIAGNOSIS — Z03818 Encounter for observation for suspected exposure to other biological agents ruled out: Secondary | ICD-10-CM | POA: Diagnosis not present

## 2022-02-27 DIAGNOSIS — R0982 Postnasal drip: Secondary | ICD-10-CM | POA: Diagnosis not present

## 2022-02-27 DIAGNOSIS — R051 Acute cough: Secondary | ICD-10-CM | POA: Diagnosis not present

## 2022-03-04 DIAGNOSIS — R059 Cough, unspecified: Secondary | ICD-10-CM | POA: Diagnosis not present

## 2022-03-04 DIAGNOSIS — R509 Fever, unspecified: Secondary | ICD-10-CM | POA: Diagnosis not present

## 2022-03-04 DIAGNOSIS — J209 Acute bronchitis, unspecified: Secondary | ICD-10-CM | POA: Diagnosis not present

## 2022-03-07 DIAGNOSIS — J019 Acute sinusitis, unspecified: Secondary | ICD-10-CM | POA: Diagnosis not present

## 2022-03-07 DIAGNOSIS — J209 Acute bronchitis, unspecified: Secondary | ICD-10-CM | POA: Diagnosis not present

## 2022-03-07 DIAGNOSIS — I517 Cardiomegaly: Secondary | ICD-10-CM | POA: Diagnosis not present

## 2022-03-07 DIAGNOSIS — J189 Pneumonia, unspecified organism: Secondary | ICD-10-CM | POA: Diagnosis not present

## 2022-03-07 DIAGNOSIS — R059 Cough, unspecified: Secondary | ICD-10-CM | POA: Diagnosis not present

## 2022-03-11 DIAGNOSIS — J189 Pneumonia, unspecified organism: Secondary | ICD-10-CM | POA: Diagnosis not present

## 2022-04-05 NOTE — Progress Notes (Unsigned)
Cardiology Office Note:    Date:  04/08/2022   ID:  Diana Haley, DOB 28-Feb-1946, MRN [id number]  PCP:  Glenis Smoker, MD   [facility] Providers Cardiologist:  Lenna Sciara, MD Referring MD: Willy Eddy, MD   Chief Complaint/Reason for Referral: Cardiomegaly  ASSESSMENT:    1. Cardiomegaly   2. Sjogren's syndrome, with unspecified organ involvement ([place])     PLAN:    In order of problems listed above: 1.  Cardiomegaly: We will obtain an echocardiogram to evaluate further. 2.  Sjogren syndrome: Continue chronic prednisone per primary care provider.             Dispo:  Return if symptoms worsen or fail to improve.      Medication Adjustments/Labs and Tests Ordered: Current medicines are reviewed at length with the patient today.  Concerns regarding medicines are outlined above.  The following changes have been made:  no change   Labs/tests ordered: Orders Placed This Encounter  Procedures   EKG 12-Lead   ECHOCARDIOGRAM COMPLETE    Medication Changes: No orders of the defined types were placed in this encounter.    Current medicines are reviewed at length with the patient today.  The patient does not have concerns regarding medicines.   History of Present Illness:    FOCUSED PROBLEM LIST:   1.  Sjogren's syndrome on chronic prednisone 2.  Depression  The patient is a 76 y.o. female with the indicated medical history here for recommendations regarding incidentally noted cardiomegaly on a chest x-ray.  That chest x-ray is not available for review.  The patient denies any chest pain, shortness of breath peripheral edema, paroxysmal nocturnal dyspnea, orthopnea, presyncope, syncope, signs or symptoms of stroke, or severe bleeding.  She is required no emergency room visits or hospitalizations for cardiovascular issues.          Current Medications: Current Meds  Medication Sig   Ascorbic Acid (VITAMIN C) 1000 MG tablet Take 1,000  mg by mouth daily.   Calcium Carbonate-Vitamin D (CALCIUM 600+D PO) Take 600 mg by mouth.   latanoprost (XALATAN) 0.005 % ophthalmic solution 1 drop at bedtime.   magnesium oxide (MAG-OX) 400 (240 Mg) MG tablet Take 400 mg by mouth daily.   Misc Natural Products (TART CHERRY ADVANCED PO) Take by mouth.   Multiple Vitamins-Minerals (CENTRUM SILVER ULTRA WOMENS PO) Take by mouth.   Omega-3 Fatty Acids (FISH OIL) 1200 MG CAPS Take by mouth.   PARoxetine (PAXIL) 20 MG tablet Take 20 mg by mouth daily.   Plant Sterols and Stanols (CHOLESTOFF) 450 MG TABS Take by mouth.   TURMERIC PO Take by mouth.   [DISCONTINUED] cevimeline (EVOXAC) 30 MG capsule Take 30 mg by mouth 3 (three) times daily.   [DISCONTINUED] doxycycline (DORYX) 100 MG EC tablet Take 100 mg by mouth 2 (two) times daily.   [DISCONTINUED] PARoxetine (PAXIL) 20 MG tablet Take 20 mg by mouth daily.   [DISCONTINUED] predniSONE (DELTASONE) 50 MG tablet Take 50 mg by mouth daily with breakfast.   [DISCONTINUED] Pseudoephedrine-Codeine-GG 30-10-100 MG/5ML SYRP Take 10 mLs by mouth.     Allergies:    Besivance [besifloxacin hcl], Keflex [cephalexin], and Bupropion   Social History:   Social History   Tobacco Use   Smoking status: Never  Substance Use Topics   Alcohol use: No   Drug use: No     Family Hx: Family History  Problem Relation Age of Onset   Dementia Mother  Glaucoma Mother    Alzheimer's disease Mother    Depression Father        GLAUCOMA,VERTIGO, COLON POLYPS   Depression Brother    Breast cancer Neg Hx      Review of Systems:   Please see the history of present illness.    All other systems reviewed and are negative.     EKGs/Labs/Other Test Reviewed:    EKG:  EKG performed today that I personally reviewed demonstrates sinus rhythm with left axis deviation and septal infarction pattern.  Prior CV studies: None available  Other studies Reviewed: Review of the additional studies/records  demonstrates: No imaging evidence of aortic atherosclerosis or coronary artery calcification available.  Recent Labs: No results found for requested labs within last 365 days.   Recent Lipid Panel No results found for: "CHOL", "TRIG", "HDL", "LDLCALC", "LDLDIRECT"  Risk Assessment/Calculations:                Physical Exam:    VS:  BP 112/70   Pulse 85   Ht '5\' 6"'$  (1.676 m)   Wt 165 lb 12.8 oz (75.2 kg)   SpO2 94%   BMI 26.76 kg/m    Wt Readings from Last 3 Encounters:  04/08/22 165 lb 12.8 oz (75.2 kg)    GENERAL:  No apparent distress, AOx3 HEENT:  No carotid bruits, +2 carotid impulses, no scleral icterus CAR: RRR no murmurs, gallops, rubs, or thrills RES:  Clear to auscultation bilaterally ABD:  Soft, nontender, nondistended, positive bowel sounds x 4 VASC:  +2 radial pulses, +2 carotid pulses, palpable pedal pulses NEURO:  CN 2-12 grossly intact; motor and sensory grossly intact PSYCH:  No active depression or anxiety EXT:  No edema, ecchymosis, or cyanosis  Signed, Early Osmond, MD  04/08/2022 5:07 PM    [address], [place], [place]  [address] Phone: [phone]; Fax: [phone]   Note:  This document was prepared using Dragon voice recognition software and may include unintentional dictation errors.

## 2022-04-08 ENCOUNTER — Ambulatory Visit: Payer: Medicare Other | Admitting: Internal Medicine

## 2022-04-08 ENCOUNTER — Ambulatory Visit: Payer: Medicare Other | Attending: Internal Medicine | Admitting: Internal Medicine

## 2022-04-08 ENCOUNTER — Encounter: Payer: Self-pay | Admitting: Internal Medicine

## 2022-04-08 VITALS — BP 112/70 | HR 85 | Ht 66.0 in | Wt 165.8 lb

## 2022-04-08 DIAGNOSIS — M3500 Sicca syndrome, unspecified: Secondary | ICD-10-CM | POA: Diagnosis not present

## 2022-04-08 DIAGNOSIS — I517 Cardiomegaly: Secondary | ICD-10-CM | POA: Insufficient documentation

## 2022-04-08 NOTE — Patient Instructions (Signed)
Medication Instructions:  No changes  Lab Work: none   Testing/Procedures: Your physician has requested that you have an echocardiogram. Echocardiography is a painless test that uses sound waves to create images of your heart. It provides your doctor with information about the size and shape of your heart and how well your heart's chambers and valves are working. This procedure takes approximately one hour. There are no restrictions for this procedure. Please do NOT wear cologne, perfume, aftershave, or lotions (deodorant is allowed). Please arrive 15 minutes prior to your appointment time.   Follow-Up: As needed  Important Information About Sugar

## 2022-05-12 ENCOUNTER — Ambulatory Visit (HOSPITAL_COMMUNITY): Payer: Medicare Other | Attending: Internal Medicine

## 2022-05-12 DIAGNOSIS — M3500 Sicca syndrome, unspecified: Secondary | ICD-10-CM

## 2022-05-12 DIAGNOSIS — I517 Cardiomegaly: Secondary | ICD-10-CM

## 2022-05-12 LAB — ECHOCARDIOGRAM COMPLETE
Area-P 1/2: 3.31 cm2
S' Lateral: 2.9 cm

## 2022-06-14 DIAGNOSIS — H401231 Low-tension glaucoma, bilateral, mild stage: Secondary | ICD-10-CM | POA: Diagnosis not present

## 2022-10-11 ENCOUNTER — Other Ambulatory Visit: Payer: Self-pay | Admitting: Family Medicine

## 2022-10-11 DIAGNOSIS — H401231 Low-tension glaucoma, bilateral, mild stage: Secondary | ICD-10-CM | POA: Diagnosis not present

## 2022-10-11 DIAGNOSIS — Z1231 Encounter for screening mammogram for malignant neoplasm of breast: Secondary | ICD-10-CM

## 2022-11-16 ENCOUNTER — Ambulatory Visit
Admission: RE | Admit: 2022-11-16 | Discharge: 2022-11-16 | Disposition: A | Discharge status: Home / Self Care | Payer: Medicare Other | Source: Ambulatory Visit | Attending: Family Medicine | Admitting: Family Medicine

## 2022-11-16 DIAGNOSIS — Z1231 Encounter for screening mammogram for malignant neoplasm of breast: Secondary | ICD-10-CM | POA: Diagnosis not present

## 2022-12-29 DIAGNOSIS — H401231 Low-tension glaucoma, bilateral, mild stage: Secondary | ICD-10-CM | POA: Diagnosis not present

## 2023-01-27 DIAGNOSIS — D225 Melanocytic nevi of trunk: Secondary | ICD-10-CM | POA: Diagnosis not present

## 2023-01-27 DIAGNOSIS — L821 Other seborrheic keratosis: Secondary | ICD-10-CM | POA: Diagnosis not present

## 2023-02-03 DIAGNOSIS — Z0000 Encounter for general adult medical examination without abnormal findings: Secondary | ICD-10-CM | POA: Diagnosis not present

## 2023-02-03 DIAGNOSIS — F331 Major depressive disorder, recurrent, moderate: Secondary | ICD-10-CM | POA: Diagnosis not present

## 2023-02-03 DIAGNOSIS — Z23 Encounter for immunization: Secondary | ICD-10-CM | POA: Diagnosis not present

## 2023-02-03 DIAGNOSIS — E785 Hyperlipidemia, unspecified: Secondary | ICD-10-CM | POA: Diagnosis not present

## 2023-02-03 DIAGNOSIS — M3500 Sicca syndrome, unspecified: Secondary | ICD-10-CM | POA: Diagnosis not present

## 2023-02-03 DIAGNOSIS — Z5181 Encounter for therapeutic drug level monitoring: Secondary | ICD-10-CM | POA: Diagnosis not present

## 2023-02-14 DIAGNOSIS — Z79899 Other long term (current) drug therapy: Secondary | ICD-10-CM | POA: Diagnosis not present

## 2023-02-14 DIAGNOSIS — Z6827 Body mass index (BMI) 27.0-27.9, adult: Secondary | ICD-10-CM | POA: Diagnosis not present

## 2023-02-14 DIAGNOSIS — M25551 Pain in right hip: Secondary | ICD-10-CM | POA: Diagnosis not present

## 2023-02-14 DIAGNOSIS — M310 Hypersensitivity angiitis: Secondary | ICD-10-CM | POA: Diagnosis not present

## 2023-02-14 DIAGNOSIS — E663 Overweight: Secondary | ICD-10-CM | POA: Diagnosis not present

## 2023-02-14 DIAGNOSIS — M3500 Sicca syndrome, unspecified: Secondary | ICD-10-CM | POA: Diagnosis not present

## 2023-03-02 ENCOUNTER — Encounter: Payer: Self-pay | Admitting: Podiatry

## 2023-03-02 ENCOUNTER — Ambulatory Visit (INDEPENDENT_AMBULATORY_CARE_PROVIDER_SITE_OTHER): Payer: Medicare Other | Admitting: Podiatry

## 2023-03-02 DIAGNOSIS — B351 Tinea unguium: Secondary | ICD-10-CM | POA: Diagnosis not present

## 2023-03-02 NOTE — Progress Notes (Signed)
Subjective:  Patient ID: Diana Haley, female    DOB: May 12, 1945,  MRN: [id number]  Chief Complaint  Patient presents with   Nail Problem    Patient thinks she has nail fungus on both feet about 3 months    77 y.o. female presents with the above complaint.  Patient presents with thickened onychodystrophy mycotic toenails x 8 mild pain on palpation.  She wants to discuss treatment options for nail fungus.  She does not want to oral medication she is tried over-the-counter which has not helped she would like to do laser therapy.   Review of Systems: Negative except as noted in the HPI. Denies N/V/F/Ch.  Past Medical History:  Diagnosis Date   Abdominal pain    Anxiety    Cardiomegaly    Celiac disease    Cholelithiasis    Constipation    Depression    Dyspepsia    Esophageal reflux    Estrogen deficiency    GERD (gastroesophageal reflux disease)    Hematochezia    Hemorrhoids    History of ulcer disease    Hot flashes    Hot flashes    Hyperlipidemia    IBS (irritable bowel syndrome)    Lymphocytosis (symptomatic)    Neck pain    Neck pain    Sjogren's syndrome (HCC)     Current Outpatient Medications:    Ascorbic Acid (VITAMIN C) 1000 MG tablet, Take 1,000 mg by mouth daily., Disp: , Rfl:    Calcium Carbonate-Vitamin D (CALCIUM 600+D PO), Take 600 mg by mouth., Disp: , Rfl:    latanoprost (XALATAN) 0.005 % ophthalmic solution, 1 drop at bedtime., Disp: , Rfl:    magnesium oxide (MAG-OX) 400 (240 Mg) MG tablet, Take 400 mg by mouth daily., Disp: , Rfl:    Misc Natural Products (TART CHERRY ADVANCED PO), Take by mouth., Disp: , Rfl:    Multiple Vitamins-Minerals (CENTRUM SILVER ULTRA WOMENS PO), Take by mouth., Disp: , Rfl:    Omega-3 Fatty Acids (FISH OIL) 1200 MG CAPS, Take by mouth., Disp: , Rfl:    PARoxetine (PAXIL) 20 MG tablet, Take 20 mg by mouth daily., Disp: , Rfl:    Plant Sterols and Stanols (CHOLESTOFF) 450 MG TABS, Take by mouth., Disp: , Rfl:     TURMERIC PO, Take by mouth., Disp: , Rfl:   Social History   Tobacco Use  Smoking Status Never  Smokeless Tobacco Not on file    Allergies  Allergen Reactions   Besivance [Besifloxacin Hcl]    Keflex [Cephalexin]    Bupropion Rash   Objective:  There were no vitals filed for this visit. There is no height or weight on file to calculate BMI. Constitutional Well developed. Well nourished.  Vascular Dorsalis pedis pulses palpable bilaterally. Posterior tibial pulses palpable bilaterally. Capillary refill normal to all digits.  No cyanosis or clubbing noted. Pedal hair growth normal.  Neurologic Normal speech. Oriented to person, place, and time. Epicritic sensation to light touch grossly present bilaterally.  Dermatologic Nails thickened and onychodystrophy mycotic toenails x 8.  No fungus noted to it. Skin within normal limits  Orthopedic: Normal joint ROM without pain or crepitus bilaterally. No visible deformities. No bony tenderness.   Radiographs: None Assessment:   1. Nail fungus   2. Onychomycosis due to dermatophyte    Plan:  Patient was evaluated and treated and all questions answered.  Toenails x 8 -Educated the patient on the etiology of onychomycosis and various treatment options associated  with improving the fungal load.  I explained to the patient that there is 3 treatment options available to treat the onychomycosis including topical, p.o., laser treatment.  Patient elected to undergo laser therapy.  She will be scheduled to undergo laser with the laser tag.  Instructed her there will take 6 or 7 applications   No follow-ups on file.

## 2023-03-03 DIAGNOSIS — R21 Rash and other nonspecific skin eruption: Secondary | ICD-10-CM | POA: Diagnosis not present

## 2023-03-04 DIAGNOSIS — R946 Abnormal results of thyroid function studies: Secondary | ICD-10-CM | POA: Diagnosis not present

## 2023-05-06 ENCOUNTER — Other Ambulatory Visit: Payer: Medicare Other

## 2023-05-16 DIAGNOSIS — H401231 Low-tension glaucoma, bilateral, mild stage: Secondary | ICD-10-CM | POA: Diagnosis not present

## 2023-06-01 DIAGNOSIS — H401231 Low-tension glaucoma, bilateral, mild stage: Secondary | ICD-10-CM | POA: Diagnosis not present

## 2023-06-01 DIAGNOSIS — H04123 Dry eye syndrome of bilateral lacrimal glands: Secondary | ICD-10-CM | POA: Diagnosis not present

## 2023-06-01 DIAGNOSIS — Z9889 Other specified postprocedural states: Secondary | ICD-10-CM | POA: Diagnosis not present

## 2023-06-01 DIAGNOSIS — Z961 Presence of intraocular lens: Secondary | ICD-10-CM | POA: Diagnosis not present

## 2023-06-13 DIAGNOSIS — H401231 Low-tension glaucoma, bilateral, mild stage: Secondary | ICD-10-CM | POA: Diagnosis not present

## 2023-06-27 DIAGNOSIS — R197 Diarrhea, unspecified: Secondary | ICD-10-CM | POA: Diagnosis not present

## 2023-06-27 DIAGNOSIS — R195 Other fecal abnormalities: Secondary | ICD-10-CM | POA: Diagnosis not present

## 2023-06-28 DIAGNOSIS — R197 Diarrhea, unspecified: Secondary | ICD-10-CM | POA: Diagnosis not present

## 2023-08-15 DIAGNOSIS — H401231 Low-tension glaucoma, bilateral, mild stage: Secondary | ICD-10-CM | POA: Diagnosis not present

## 2023-08-18 DIAGNOSIS — R195 Other fecal abnormalities: Secondary | ICD-10-CM | POA: Diagnosis not present

## 2023-08-23 DIAGNOSIS — M25551 Pain in right hip: Secondary | ICD-10-CM | POA: Diagnosis not present

## 2023-08-23 DIAGNOSIS — Z6826 Body mass index (BMI) 26.0-26.9, adult: Secondary | ICD-10-CM | POA: Diagnosis not present

## 2023-08-23 DIAGNOSIS — Z79899 Other long term (current) drug therapy: Secondary | ICD-10-CM | POA: Diagnosis not present

## 2023-08-23 DIAGNOSIS — M3501 Sicca syndrome with keratoconjunctivitis: Secondary | ICD-10-CM | POA: Diagnosis not present

## 2023-08-23 DIAGNOSIS — M310 Hypersensitivity angiitis: Secondary | ICD-10-CM | POA: Diagnosis not present

## 2023-08-23 DIAGNOSIS — E663 Overweight: Secondary | ICD-10-CM | POA: Diagnosis not present

## 2023-09-16 ENCOUNTER — Other Ambulatory Visit

## 2023-10-17 ENCOUNTER — Other Ambulatory Visit: Payer: Self-pay | Admitting: Family Medicine

## 2023-10-17 DIAGNOSIS — Z1231 Encounter for screening mammogram for malignant neoplasm of breast: Secondary | ICD-10-CM

## 2023-11-14 DIAGNOSIS — H401231 Low-tension glaucoma, bilateral, mild stage: Secondary | ICD-10-CM | POA: Diagnosis not present

## 2023-11-17 ENCOUNTER — Ambulatory Visit

## 2023-11-24 ENCOUNTER — Ambulatory Visit
Admission: RE | Admit: 2023-11-24 | Discharge: 2023-11-24 | Disposition: A | Discharge status: Home / Self Care | Source: Ambulatory Visit | Attending: Family Medicine

## 2023-11-24 DIAGNOSIS — Z1231 Encounter for screening mammogram for malignant neoplasm of breast: Secondary | ICD-10-CM | POA: Diagnosis not present

## 2024-01-16 DIAGNOSIS — Z23 Encounter for immunization: Secondary | ICD-10-CM | POA: Diagnosis not present

## 2024-01-30 DIAGNOSIS — D2272 Melanocytic nevi of left lower limb, including hip: Secondary | ICD-10-CM | POA: Diagnosis not present

## 2024-01-30 DIAGNOSIS — L905 Scar conditions and fibrosis of skin: Secondary | ICD-10-CM | POA: Diagnosis not present

## 2024-01-30 DIAGNOSIS — D2261 Melanocytic nevi of right upper limb, including shoulder: Secondary | ICD-10-CM | POA: Diagnosis not present

## 2024-01-30 DIAGNOSIS — D2271 Melanocytic nevi of right lower limb, including hip: Secondary | ICD-10-CM | POA: Diagnosis not present

## 2024-01-30 DIAGNOSIS — D2262 Melanocytic nevi of left upper limb, including shoulder: Secondary | ICD-10-CM | POA: Diagnosis not present

## 2024-01-30 DIAGNOSIS — L821 Other seborrheic keratosis: Secondary | ICD-10-CM | POA: Diagnosis not present

## 2024-02-06 DIAGNOSIS — M3500 Sicca syndrome, unspecified: Secondary | ICD-10-CM | POA: Diagnosis not present

## 2024-02-06 DIAGNOSIS — E785 Hyperlipidemia, unspecified: Secondary | ICD-10-CM | POA: Diagnosis not present

## 2024-02-06 DIAGNOSIS — Z0000 Encounter for general adult medical examination without abnormal findings: Secondary | ICD-10-CM | POA: Diagnosis not present

## 2024-02-06 DIAGNOSIS — Z5181 Encounter for therapeutic drug level monitoring: Secondary | ICD-10-CM | POA: Diagnosis not present

## 2024-02-06 DIAGNOSIS — M25551 Pain in right hip: Secondary | ICD-10-CM | POA: Diagnosis not present

## 2024-02-06 DIAGNOSIS — Z23 Encounter for immunization: Secondary | ICD-10-CM | POA: Diagnosis not present

## 2024-02-06 DIAGNOSIS — M8589 Other specified disorders of bone density and structure, multiple sites: Secondary | ICD-10-CM | POA: Diagnosis not present

## 2024-02-06 DIAGNOSIS — E559 Vitamin D deficiency, unspecified: Secondary | ICD-10-CM | POA: Diagnosis not present

## 2024-02-06 DIAGNOSIS — R946 Abnormal results of thyroid function studies: Secondary | ICD-10-CM | POA: Diagnosis not present

## 2024-02-06 DIAGNOSIS — F331 Major depressive disorder, recurrent, moderate: Secondary | ICD-10-CM | POA: Diagnosis not present

## 2024-02-09 DIAGNOSIS — M25551 Pain in right hip: Secondary | ICD-10-CM | POA: Diagnosis not present

## 2024-03-03 DIAGNOSIS — H00019 Hordeolum externum unspecified eye, unspecified eyelid: Secondary | ICD-10-CM | POA: Diagnosis not present

## 2024-03-04 ENCOUNTER — Emergency Department (HOSPITAL_BASED_OUTPATIENT_CLINIC_OR_DEPARTMENT_OTHER)
Admission: EM | Admit: 2024-03-04 | Discharge: 2024-03-04 | Disposition: A | Discharge status: Home / Self Care | Attending: Emergency Medicine | Admitting: Emergency Medicine

## 2024-03-04 ENCOUNTER — Encounter (HOSPITAL_BASED_OUTPATIENT_CLINIC_OR_DEPARTMENT_OTHER): Payer: Self-pay

## 2024-03-04 ENCOUNTER — Other Ambulatory Visit: Payer: Self-pay

## 2024-03-04 ENCOUNTER — Emergency Department (HOSPITAL_BASED_OUTPATIENT_CLINIC_OR_DEPARTMENT_OTHER)

## 2024-03-04 DIAGNOSIS — R220 Localized swelling, mass and lump, head: Secondary | ICD-10-CM | POA: Diagnosis present

## 2024-03-04 DIAGNOSIS — H5589 Other irregular eye movements: Secondary | ICD-10-CM | POA: Insufficient documentation

## 2024-03-04 DIAGNOSIS — H00011 Hordeolum externum right upper eyelid: Secondary | ICD-10-CM | POA: Diagnosis not present

## 2024-03-04 DIAGNOSIS — H00013 Hordeolum externum right eye, unspecified eyelid: Secondary | ICD-10-CM | POA: Insufficient documentation

## 2024-03-04 DIAGNOSIS — M799 Soft tissue disorder, unspecified: Secondary | ICD-10-CM | POA: Diagnosis not present

## 2024-03-04 DIAGNOSIS — L03213 Periorbital cellulitis: Secondary | ICD-10-CM | POA: Diagnosis not present

## 2024-03-04 LAB — CBC WITH DIFFERENTIAL/PLATELET
Abs Immature Granulocytes: 0 K/uL (ref 0.00–0.07)
Basophils Absolute: 0.1 K/uL (ref 0.0–0.1)
Basophils Relative: 1 %
Eosinophils Absolute: 0.3 K/uL (ref 0.0–0.5)
Eosinophils Relative: 4 %
HCT: 46.9 % — ABNORMAL HIGH (ref 36.0–46.0)
Hemoglobin: 14.9 g/dL (ref 12.0–15.0)
Immature Granulocytes: 0 %
Lymphocytes Relative: 41 %
Lymphs Abs: 2.7 K/uL (ref 0.7–4.0)
MCH: 29.7 pg (ref 26.0–34.0)
MCHC: 31.8 g/dL (ref 30.0–36.0)
MCV: 93.6 fL (ref 80.0–100.0)
Monocytes Absolute: 0.5 K/uL (ref 0.1–1.0)
Monocytes Relative: 7 %
Neutro Abs: 3.1 K/uL (ref 1.7–7.7)
Neutrophils Relative %: 47 %
Platelets: 179 K/uL (ref 150–400)
RBC: 5.01 MIL/uL (ref 3.87–5.11)
RDW: 13.4 % (ref 11.5–15.5)
WBC: 6.5 K/uL (ref 4.0–10.5)
nRBC: 0 % (ref 0.0–0.2)

## 2024-03-04 LAB — BASIC METABOLIC PANEL WITH GFR
Anion gap: 8 (ref 5–15)
BUN: 10 mg/dL (ref 8–23)
CO2: 28 mmol/L (ref 22–32)
Calcium: 10.1 mg/dL (ref 8.9–10.3)
Chloride: 106 mmol/L (ref 98–111)
Creatinine, Ser: 0.78 mg/dL (ref 0.44–1.00)
GFR, Estimated: >60 mL/min (ref >60)
Glucose, Bld: 106 mg/dL — ABNORMAL HIGH (ref 70–99)
Potassium: 4.7 mmol/L (ref 3.5–5.1)
Sodium: 141 mmol/L (ref 135–145)

## 2024-03-04 MED ORDER — IOHEXOL 300 MG/ML  SOLN
75.0000 mL | Freq: Once | INTRAMUSCULAR | Status: AC | PRN
  Administered 2024-03-04: 75 mL via INTRAVENOUS

## 2024-03-04 MED ORDER — AMOXICILLIN-POT CLAVULANATE 875-125 MG PO TABS: 1.0000 | ORAL_TABLET | Freq: Two times a day (BID) | ORAL | 0 refills | Status: DC

## 2024-03-04 NOTE — Discharge Instructions (Addendum)
 Call your ophthalmologist tomorrow to schedule a follow-up visit

## 2024-03-04 NOTE — ED Provider Notes (Signed)
 [place] EMERGENCY DEPARTMENT AT [facility] Provider Note   CSN: [id number] Arrival date & time: 03/04/24  1036     Patient presents with: Facial Swelling   Diana Haley is a 78 y.o. female.   78 year old female presents with 2 days of right periorbital swelling mostly to her right upper lid.  Was seen in urgent care yesterday and started on tobramycin drops for suspected upper eyelid stye on the right side.  Has had no fever or chills.  Does have some pain with movement of her eye.  She denies any visual changes.  No severe headaches.  Was told to come here to rule out orbital cellulitis       Prior to Admission medications   Medication Sig Start Date End Date Taking? Authorizing Provider  Ascorbic Acid (VITAMIN C) 1000 MG tablet Take 1,000 mg by mouth daily.    [provider]  Calcium Carbonate-Vitamin D (CALCIUM 600+D PO) Take 600 mg by mouth.    [provider]  latanoprost (XALATAN) 0.005 % ophthalmic solution 1 drop at bedtime.    [provider]  magnesium oxide (MAG-OX) 400 (240 Mg) MG tablet Take 400 mg by mouth daily.    [provider]  Misc Natural Products (TART CHERRY ADVANCED PO) Take by mouth.    [provider]  Multiple Vitamins-Minerals (CENTRUM SILVER ULTRA WOMENS PO) Take by mouth.    [provider]  Omega-3 Fatty Acids (FISH OIL) 1200 MG CAPS Take by mouth.    [provider]  PARoxetine (PAXIL) 20 MG tablet Take 20 mg by mouth daily.    [provider]  Plant Sterols and Stanols (CHOLESTOFF) 450 MG TABS Take by mouth.    [provider]  TURMERIC PO Take by mouth.    [provider]    Allergies: Besivance [besifloxacin hcl], Keflex [cephalexin], and Bupropion    Review of Systems  All other systems reviewed and are negative.   Updated Vital Signs BP (!) 154/103 (BP Location: Right Arm)   Pulse 76   Temp 98.2 F (36.8 C) (Oral)   Resp 16    Ht 1.676 m (5' 6)   Wt 71.7 kg   SpO2 99%   BMI 25.50 kg/m   Physical Exam Vitals and nursing note reviewed.  Constitutional:      General: She is not in acute distress.    Appearance: Normal appearance. She is well-developed. She is not toxic-appearing.  HENT:     Head: Normocephalic and atraumatic.  Eyes:     General: Lids are normal.        Right eye: Hordeolum present.     Extraocular Movements:     Right eye: Normal extraocular motion.     Left eye: Abnormal extraocular motion present.     Conjunctiva/sclera: Conjunctivae normal.     Right eye: Right conjunctiva is not injected.     Pupils: Pupils are equal, round, and reactive to light.     Comments: Slight periorbital edema noted.  No exophthalmos  Neck:     Thyroid : No thyroid  mass.     Trachea: No tracheal deviation.  Cardiovascular:     Rate and Rhythm: Normal rate and regular rhythm.     Heart sounds: Normal heart sounds. No murmur heard.    No gallop.  Pulmonary:     Effort: Pulmonary effort is normal. No respiratory distress.     Breath sounds: Normal breath sounds. No stridor. No decreased breath  sounds, wheezing, rhonchi or rales.  Abdominal:     General: There is no distension.     Palpations: Abdomen is soft.     Tenderness: There is no abdominal tenderness. There is no rebound.  Musculoskeletal:        General: No tenderness. Normal range of motion.     Cervical back: Normal range of motion and neck supple.  Skin:    General: Skin is warm and dry.     Findings: No abrasion or rash.  Neurological:     Mental Status: She is alert and oriented to person, place, and time. Mental status is at baseline.     GCS: GCS eye subscore is 4. GCS verbal subscore is 5. GCS motor subscore is 6.     Cranial Nerves: No cranial nerve deficit.     Sensory: No sensory deficit.     Motor: Motor function is intact.  Psychiatric:        Attention and Perception: Attention normal.        Speech: Speech normal.         Behavior: Behavior normal.     (all labs ordered are listed, but only abnormal results are displayed) Labs Reviewed - No data to display  EKG: None  Radiology: No results found.   Procedures   Medications Ordered in the ED - No data to display                                  Medical Decision Making Amount and/or Complexity of Data Reviewed Labs: ordered. Radiology: ordered.  Risk Prescription drug management.   CBC without evidence leukocytosis.  CT orbits show preseptal cellulitis.  No evidence of postseptal inflammation.  Will place on Augmentin and patient to follow-up with her ophthalmologist this week     Final diagnoses:  None    ED Discharge Orders     None          Dasie Faden, MD 03/04/24 1304

## 2024-03-04 NOTE — ED Notes (Signed)
 Patient transported to CT

## 2024-03-04 NOTE — ED Triage Notes (Signed)
 Patient started treatment yesterday for sty with tobramycin drops. Patient presents to ED with c/o worsening swelling, clear drainage from eye.

## 2024-03-09 DIAGNOSIS — M1611 Unilateral primary osteoarthritis, right hip: Secondary | ICD-10-CM | POA: Diagnosis not present

## 2024-03-09 DIAGNOSIS — M25551 Pain in right hip: Secondary | ICD-10-CM | POA: Diagnosis not present

## 2024-03-13 DIAGNOSIS — H02881 Meibomian gland dysfunction right upper eyelid: Secondary | ICD-10-CM | POA: Diagnosis not present

## 2024-03-13 DIAGNOSIS — H0011 Chalazion right upper eyelid: Secondary | ICD-10-CM | POA: Diagnosis not present

## 2024-03-13 DIAGNOSIS — H401231 Low-tension glaucoma, bilateral, mild stage: Secondary | ICD-10-CM | POA: Diagnosis not present

## 2024-03-20 DIAGNOSIS — M25551 Pain in right hip: Secondary | ICD-10-CM | POA: Diagnosis not present

## 2024-03-28 DIAGNOSIS — M8580 Other specified disorders of bone density and structure, unspecified site: Secondary | ICD-10-CM | POA: Diagnosis not present

## 2024-04-01 ENCOUNTER — Emergency Department (HOSPITAL_BASED_OUTPATIENT_CLINIC_OR_DEPARTMENT_OTHER)

## 2024-04-01 ENCOUNTER — Other Ambulatory Visit: Payer: Self-pay

## 2024-04-01 ENCOUNTER — Encounter (HOSPITAL_BASED_OUTPATIENT_CLINIC_OR_DEPARTMENT_OTHER): Payer: Self-pay | Admitting: Emergency Medicine

## 2024-04-01 ENCOUNTER — Emergency Department (HOSPITAL_BASED_OUTPATIENT_CLINIC_OR_DEPARTMENT_OTHER)
Admission: EM | Admit: 2024-04-01 | Discharge: 2024-04-01 | Disposition: A | Discharge status: Home / Self Care | Attending: Emergency Medicine | Admitting: Emergency Medicine

## 2024-04-01 DIAGNOSIS — R600 Localized edema: Secondary | ICD-10-CM | POA: Insufficient documentation

## 2024-04-01 DIAGNOSIS — R220 Localized swelling, mass and lump, head: Secondary | ICD-10-CM | POA: Diagnosis not present

## 2024-04-01 LAB — BASIC METABOLIC PANEL WITH GFR
Anion gap: 9 (ref 5–15)
BUN: 14 mg/dL (ref 8–23)
CO2: 27 mmol/L (ref 22–32)
Calcium: 9.9 mg/dL (ref 8.9–10.3)
Chloride: 103 mmol/L (ref 98–111)
Creatinine, Ser: 0.78 mg/dL (ref 0.44–1.00)
GFR, Estimated: >60 mL/min (ref >60)
Glucose, Bld: 106 mg/dL — ABNORMAL HIGH (ref 70–99)
Potassium: 4.2 mmol/L (ref 3.5–5.1)
Sodium: 139 mmol/L (ref 135–145)

## 2024-04-01 LAB — CBC WITH DIFFERENTIAL/PLATELET
Abs Immature Granulocytes: 0.03 K/uL (ref 0.00–0.07)
Basophils Absolute: 0.1 K/uL (ref 0.0–0.1)
Basophils Relative: 1 %
Eosinophils Absolute: 0.4 K/uL (ref 0.0–0.5)
Eosinophils Relative: 5 %
HCT: 45.3 % (ref 36.0–46.0)
Hemoglobin: 14.5 g/dL (ref 12.0–15.0)
Immature Granulocytes: 0 %
Lymphocytes Relative: 34 %
Lymphs Abs: 2.3 K/uL (ref 0.7–4.0)
MCH: 29.7 pg (ref 26.0–34.0)
MCHC: 32 g/dL (ref 30.0–36.0)
MCV: 92.8 fL (ref 80.0–100.0)
Monocytes Absolute: 0.6 K/uL (ref 0.1–1.0)
Monocytes Relative: 9 %
Neutro Abs: 3.5 K/uL (ref 1.7–7.7)
Neutrophils Relative %: 51 %
Platelets: 182 K/uL (ref 150–400)
RBC: 4.88 MIL/uL (ref 3.87–5.11)
RDW: 13.2 % (ref 11.5–15.5)
WBC: 6.9 K/uL (ref 4.0–10.5)
nRBC: 0 % (ref 0.0–0.2)

## 2024-04-01 MED ORDER — ACETAMINOPHEN 325 MG PO TABS
650.0000 mg | ORAL_TABLET | Freq: Once | ORAL | Status: DC
  Filled 2024-04-01: qty 2

## 2024-04-01 MED ORDER — IOHEXOL 300 MG/ML  SOLN
100.0000 mL | Freq: Once | INTRAMUSCULAR | Status: AC | PRN
  Administered 2024-04-01: 100 mL via INTRAVENOUS

## 2024-04-01 MED ORDER — ACETAMINOPHEN 500 MG PO TABS
1000.0000 mg | ORAL_TABLET | Freq: Once | ORAL | Status: AC
  Administered 2024-04-01: 1000 mg via ORAL
  Filled 2024-04-01: qty 2

## 2024-04-01 MED ORDER — AMOXICILLIN-POT CLAVULANATE 875-125 MG PO TABS: 1.0000 | ORAL_TABLET | Freq: Two times a day (BID) | ORAL | 0 refills | Status: DC

## 2024-04-01 NOTE — Discharge Instructions (Addendum)
 It was a pleasure taking care of you today.  As discussed, CT imaging showed a 14 x 9 x 12 mm nodule overlying your left mandible.  It is unclear what this nodule is.  I am sending you home with an antibiotic to cover for possible infection.  Please discuss with your dentist tomorrow at your scheduled appointment.  They may need to be further testing to rule out cancer.  Return to the ER for any worsening symptoms.  I have included the number of the ENT doctor. Call tomorrow to schedule an appointment.

## 2024-04-01 NOTE — ED Triage Notes (Signed)
 Pt endorses LT lower face swelling x  2days. Redness and swelling noted. Pt reports that it feels like internal mouth pain and swelling

## 2024-04-01 NOTE — ED Provider Notes (Signed)
 [place] EMERGENCY DEPARTMENT AT [facility] Provider Note   CSN: [id number] Arrival date & time: 04/01/24  9162     Patient presents with: Facial Swelling   Diana Haley is a 78 y.o. female with a past medical history significant for Sjogren syndrome, IBS, hyperlipidemia, anxiety, and depression who presents to the ED due to hard nodule in left cheek.  Patient states nodule has been present for numerous years however, started to grow in size over the past 2 days.  Patient admits to significant pain around nodule.  Rates her pain a 7/10.  Denies fever and chills.  No difficulties swallowing or shortness of breath.  Denies any drainage. Denies trismus and changes to phonation.  No known injury.  Was recently treated for preseptal cellulitis with antibiotics which she finished. Sees a dentist regularly. Has an appointment tomorrow for routine cleaning. Denies dental pain.   History obtained from patient and past medical records. No interpreter used during encounter.       Prior to Admission medications   Medication Sig Start Date End Date Taking? Authorizing Provider  amoxicillin -clavulanate (AUGMENTIN ) 875-125 MG tablet Take 1 tablet by mouth every 12 (twelve) hours. 04/01/24  Yes Klaire Court C, PA-C  amoxicillin -clavulanate (AUGMENTIN ) 875-125 MG tablet Take 1 tablet by mouth every 12 (twelve) hours. 03/04/24   Dasie Faden, MD  Ascorbic Acid (VITAMIN C) 1000 MG tablet Take 1,000 mg by mouth daily.    [provider]  Calcium Carbonate-Vitamin D (CALCIUM 600+D PO) Take 600 mg by mouth.    [provider]  latanoprost (XALATAN) 0.005 % ophthalmic solution 1 drop at bedtime.    [provider]  magnesium oxide (MAG-OX) 400 (240 Mg) MG tablet Take 400 mg by mouth daily.    [provider]  Misc Natural Products (TART CHERRY ADVANCED PO) Take by mouth.    [provider]  Multiple Vitamins-Minerals (CENTRUM SILVER ULTRA  WOMENS PO) Take by mouth.    [provider]  Omega-3 Fatty Acids (FISH OIL) 1200 MG CAPS Take by mouth.    [provider]  PARoxetine (PAXIL) 20 MG tablet Take 20 mg by mouth daily.    [provider]  Plant Sterols and Stanols (CHOLESTOFF) 450 MG TABS Take by mouth.    [provider]  TURMERIC PO Take by mouth.    [provider]    Allergies: Besivance [besifloxacin hcl], Keflex [cephalexin], Latanoprost, and Bupropion    Review of Systems  Constitutional:  Negative for chills and fever.  HENT:  Positive for facial swelling.     Updated Vital Signs BP 139/82   Pulse 61   Temp 98.3 F (36.8 C)   Resp 16   Wt 71.7 kg   SpO2 100%   BMI 25.50 kg/m   Physical Exam Vitals and nursing note reviewed.  Constitutional:      General: She is not in acute distress.    Appearance: She is not ill-appearing.  HENT:     Head: Normocephalic.     Mouth/Throat:     Comments: Hard nodule in left lower cheek. Slight erythema to skin. No fluctuance.  No trismus.  Normal phonation.  Tolerating oral secretions without difficulty.  No tenderness throughout teeth.  Eyes:     Pupils: Pupils are equal, round, and reactive to light.  Cardiovascular:     Rate and Rhythm: Normal rate and regular rhythm.     Pulses: Normal pulses.     Heart sounds:  Normal heart sounds. No murmur heard.    No friction rub. No gallop.  Pulmonary:     Effort: Pulmonary effort is normal.     Breath sounds: Normal breath sounds.  Abdominal:     General: Abdomen is flat. There is no distension.     Palpations: Abdomen is soft.     Tenderness: There is no abdominal tenderness. There is no guarding or rebound.  Musculoskeletal:        General: Normal range of motion.     Cervical back: Neck supple.  Skin:    General: Skin is warm and dry.  Neurological:     General: No focal deficit present.     Mental Status: She is alert.  Psychiatric:        Mood and Affect: Mood  normal.        Behavior: Behavior normal.     (all labs ordered are listed, but only abnormal results are displayed) Labs Reviewed  BASIC METABOLIC PANEL WITH GFR - Abnormal; Notable for the following components:      Result Value   Glucose, Bld 106 (*)    All other components within normal limits  CBC WITH DIFFERENTIAL/PLATELET    EKG: None  Radiology: CT Maxillofacial W Contrast Result Date: 04/01/2024 EXAM: CT Face with contrast 04/01/2024 10:37:42 AM TECHNIQUE: CT of the face was performed with the administration of 100 mL of iohexol  (OMNIPAQUE ) 300 MG/ML solution. Multiplanar reformatted images are provided for review. Automated exposure control, iterative reconstruction, and/or weight based adjustment of the mA/kV was utilized to reduce the radiation dose to as low as reasonably achievable. COMPARISON: None available CLINICAL HISTORY: large hard nodule in left lower cheek FINDINGS: AERODIGESTIVE TRACT: No mass. No edema. SALIVARY GLANDS: No acute abnormality. LYMPH NODES: No suspicious cervical lymphadenopathy. SOFT TISSUES: There is a peripherally enhancing centrally hypodense nodule overlying the body of the left mandible measuring approximately 14 x 9 x 12 mm. There is also mild adjacent fatty stranding. There is no frank odontogenic origin of the inflammatory process. BRAIN, ORBITS AND SINUSES: No acute abnormality. BONES: No acute abnormality. No suspicious bone lesion. There are no lesions evident elsewhere. IMPRESSION: 1. Peripherally enhancing centrally hypodense nodule overlying the body of the left mandible measuring approximately 14 x 9 x 12 mm, with mild adjacent fatty stranding. No frank odontogenic origin of presumed inflammatory process. Clinical correlation and follow up is recommended to exclude malignancy. Electronically signed by: Evalene Coho MD 04/01/2024 11:14 AM EST RP Workstation: [id number]     Procedures   Medications Ordered in the ED  acetaminophen   (TYLENOL ) tablet 1,000 mg (1,000 mg Oral Given 04/01/24 0945)  iohexol  (OMNIPAQUE ) 300 MG/ML solution 100 mL (100 mLs Intravenous Contrast Given 04/01/24 1028)                                    Medical Decision Making Amount and/or Complexity of Data Reviewed Labs: ordered. Decision-making details documented in ED Course. Radiology: ordered and independent interpretation performed. Decision-making details documented in ED Course.  Risk OTC drugs. Prescription drug management.   This patient presents to the ED for concern of cheek nodule, this involves an extensive number of treatment options, and is a complaint that carries with it a high risk of complications and morbidity.  The differential diagnosis includes abscess, malignancy, salivary stone, etc  78 year old female presents to the ED due to nodule to left cheek.  Nodule has been present for numerous years however, has grown in size over the past 2 days.  No drainage.  Admits to some facial edema.  Denies fever and chills.  Upon arrival, stable vitals.  BP elevated 171/110 likely secondary to pain.  Patient well-appearing on exam. Hard nodule in left lower cheek. Slight erythema to skin. No fluctuance.  No trismus.  Normal phonation.  Tolerating oral secretions without difficulty.  No tenderness throughout teeth. CT scan ordered. Tylenol given. Routine labs.   CBC with no leukocytosis.  Normal hemoglobin.  BMP reassuring.  Normal renal function.  No major electrolyte derangements.  CT maxillofacial personally reviewed and interpreted demonstrates a hypodense nodule overlying the body of left mandible measuring 14 x 9 x 12 mm.  No frank odontogenic origin of presumed inflammatory process.  Unclear cause of nodule.  Will cover with antibiotic for possible infection.  Patient has a education officer, community appointment tomorrow.  Advised patient to discuss results and need for further testing to rule out malignancy.  Low suspicion for Ludwigs or deep space  infection. Patient stable for discharge. Strict ED precautions discussed with patient. Patient states understanding and agrees to plan. Patient discharged home in no acute distress and stable vitals  Discussed with Dr. Ellouise who agrees with assessment and plan.  Elderly >65 Hx Sjogren's syndrome    Final diagnoses:  Facial edema    ED Discharge Orders          Ordered    amoxicillin -clavulanate (AUGMENTIN ) 875-125 MG tablet  Every 12 hours        04/01/24 1155               Stephaney Steven C, PA-C 04/01/24 1246    Kingsley, Victoria K, DO 04/01/24 1449

## 2024-04-01 NOTE — ED Notes (Signed)
 Pt d/c instructions, medications, and follow-up care reviewed with pt. Pt verbalized understanding and had no further questions at time of d/c. Pt CA&Ox4, ambulatory, and in NAD at time of d/c

## 2024-04-02 DIAGNOSIS — K122 Cellulitis and abscess of mouth: Secondary | ICD-10-CM | POA: Diagnosis not present

## 2024-04-03 DIAGNOSIS — K1121 Acute sialoadenitis: Secondary | ICD-10-CM | POA: Diagnosis not present

## 2024-04-03 DIAGNOSIS — K122 Cellulitis and abscess of mouth: Secondary | ICD-10-CM | POA: Diagnosis not present

## 2024-04-09 DIAGNOSIS — H401231 Low-tension glaucoma, bilateral, mild stage: Secondary | ICD-10-CM | POA: Diagnosis not present

## 2024-04-10 ENCOUNTER — Institutional Professional Consult (permissible substitution) (INDEPENDENT_AMBULATORY_CARE_PROVIDER_SITE_OTHER): Admitting: Otolaryngology

## 2024-06-14 ENCOUNTER — Encounter (HOSPITAL_COMMUNITY): Admission: RE | Admit: 2024-06-14

## 2024-06-26 ENCOUNTER — Ambulatory Visit (HOSPITAL_COMMUNITY): Admit: 2024-06-26 | Admitting: Orthopedic Surgery

## 2024-06-26 DIAGNOSIS — M1611 Unilateral primary osteoarthritis, right hip: Secondary | ICD-10-CM

## 2024-06-26 SURGERY — ARTHROPLASTY, HIP, TOTAL, ANTERIOR APPROACH
Anesthesia: Spinal | Site: Hip | Laterality: Right
